# Patient Record
Sex: Female | Born: 1980 | Hispanic: Yes | Marital: Married | Smoking: Never smoker
Health system: Southern US, Community
[De-identification: ages and names within clinical notes are randomized; demographics above are authoritative.]

---

## 2006-04-25 ENCOUNTER — Inpatient Hospital Stay (HOSPITAL_COMMUNITY): Admission: AD | Admit: 2006-04-25 | Discharge: 2006-04-25 | Payer: Self-pay | Admitting: Obstetrics and Gynecology

## 2006-07-11 ENCOUNTER — Ambulatory Visit (HOSPITAL_COMMUNITY): Admission: RE | Admit: 2006-07-11 | Discharge: 2006-07-11 | Payer: Self-pay | Admitting: Obstetrics & Gynecology

## 2006-11-02 ENCOUNTER — Ambulatory Visit: Payer: Self-pay | Admitting: Obstetrics and Gynecology

## 2006-11-02 ENCOUNTER — Inpatient Hospital Stay (HOSPITAL_COMMUNITY): Admission: AD | Admit: 2006-11-02 | Discharge: 2006-11-02 | Payer: Self-pay | Admitting: Gynecology

## 2006-12-01 ENCOUNTER — Inpatient Hospital Stay (HOSPITAL_COMMUNITY): Admission: AD | Admit: 2006-12-01 | Discharge: 2006-12-03 | Payer: Self-pay | Admitting: Obstetrics & Gynecology

## 2006-12-01 ENCOUNTER — Ambulatory Visit: Payer: Self-pay | Admitting: Obstetrics & Gynecology

## 2006-12-02 ENCOUNTER — Ambulatory Visit: Payer: Self-pay | Admitting: Vascular Surgery

## 2007-01-31 ENCOUNTER — Emergency Department (HOSPITAL_COMMUNITY): Admission: EM | Admit: 2007-01-31 | Discharge: 2007-01-31 | Payer: Self-pay | Admitting: *Deleted

## 2007-03-26 ENCOUNTER — Encounter: Payer: Self-pay | Admitting: Obstetrics and Gynecology

## 2007-03-26 ENCOUNTER — Ambulatory Visit: Payer: Self-pay | Admitting: Obstetrics & Gynecology

## 2007-03-26 ENCOUNTER — Other Ambulatory Visit: Admission: RE | Admit: 2007-03-26 | Discharge: 2007-03-26 | Payer: Self-pay | Admitting: Obstetrics and Gynecology

## 2007-04-06 ENCOUNTER — Emergency Department (HOSPITAL_COMMUNITY): Admission: EM | Admit: 2007-04-06 | Discharge: 2007-04-06 | Payer: Self-pay | Admitting: Emergency Medicine

## 2007-09-13 IMAGING — US US OB TRANSVAGINAL MODIFY
1 series · 14 of 28 positions shown · non-contrast
Comparison: none

CLINICAL DATA: Positive pregnancy test with vaginal spotting.

 OBSTETRICAL ULTRASOUND <14 WKS AND TRANSVAGINAL OB US:
 Number of Fetuses:  1
 Heart Rate:  133 bpm
 CRL:    1.1 mm   7 w  1 d  US EDC:  12/11/06
 Fetal anatomy could not be evaluated due to the early gestational age.
 MATERNAL UTERINE AND ADNEXAL FINDINGS
 Cervix not evaluated.

[Series 1: us ob transvaginal modify · 0.31mm/px · 14 of 67 slices shown]
[im 3/67]
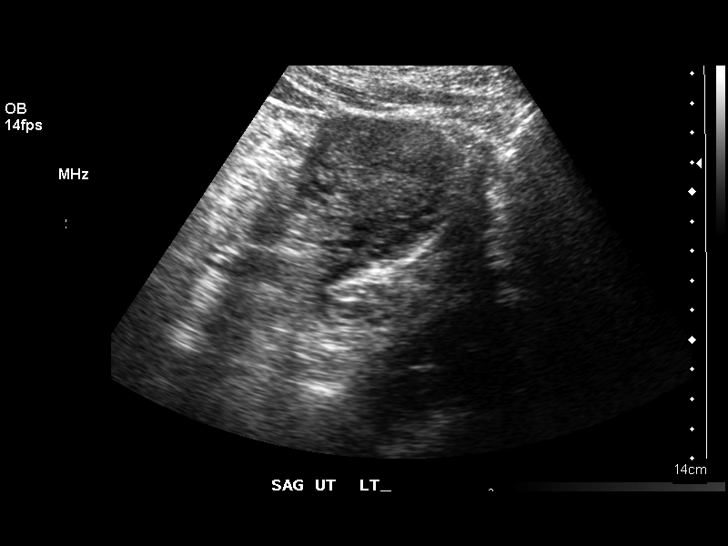
[im 8/67]
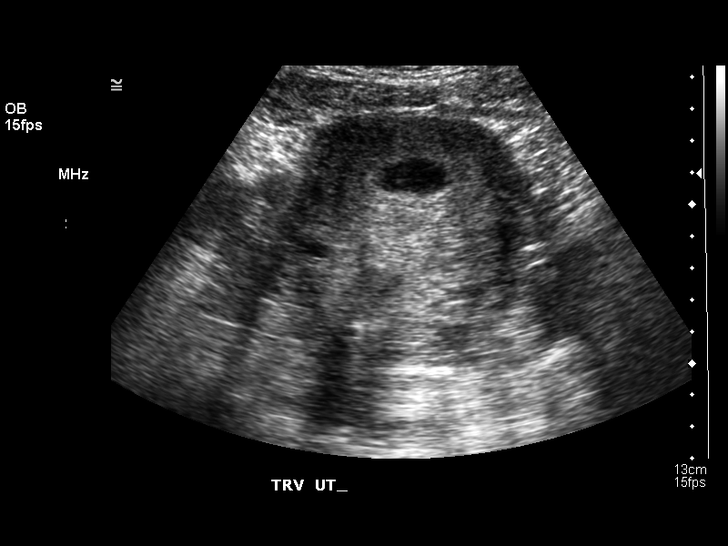
[im 13/67]
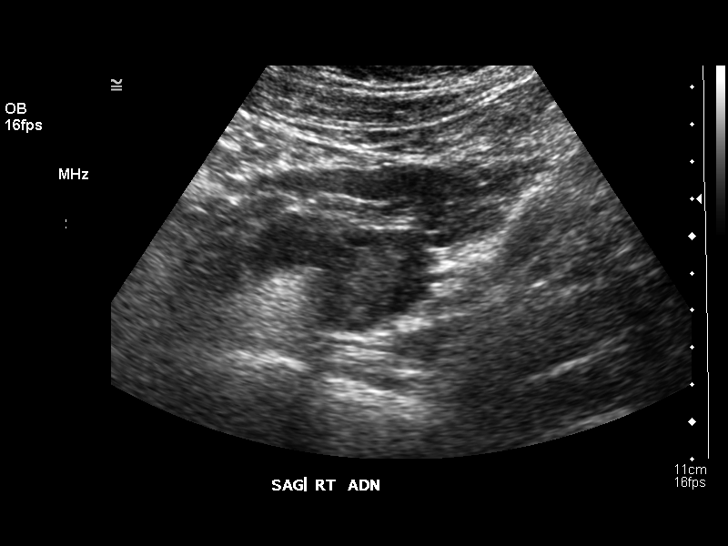
[im 18/67]
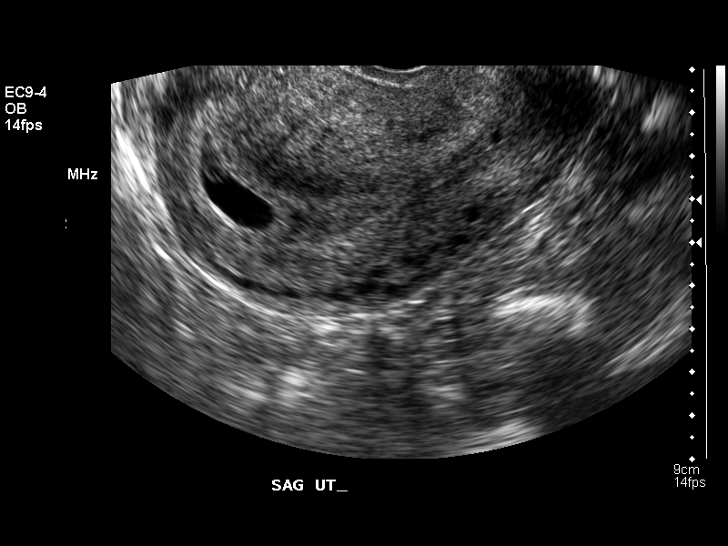
[im 23/67]
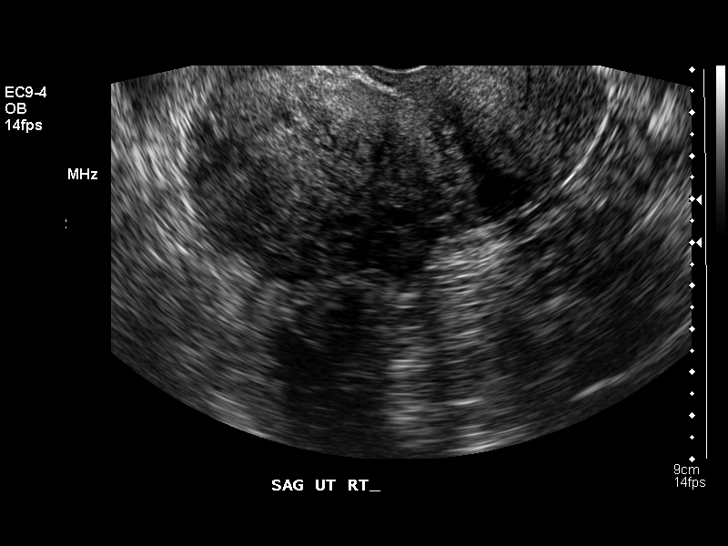
[im 27/67]
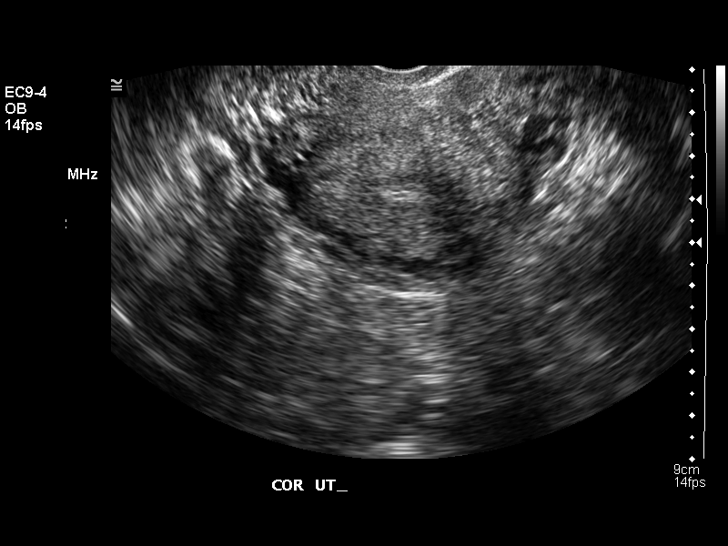
[im 32/67]
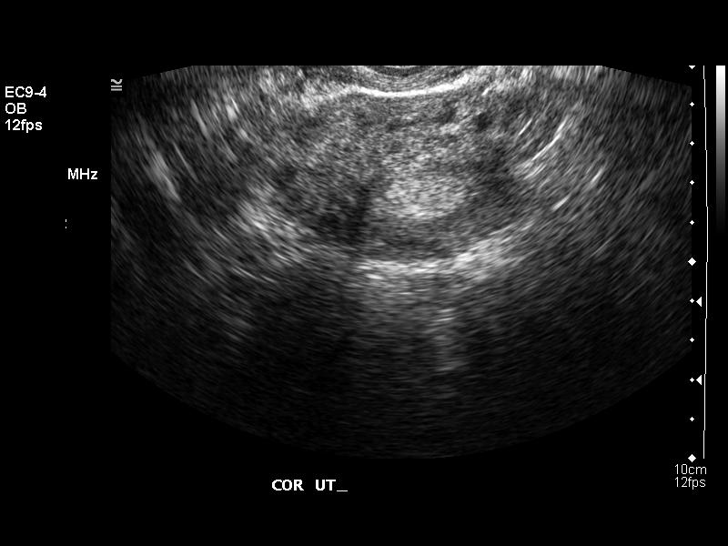
[im 37/67]
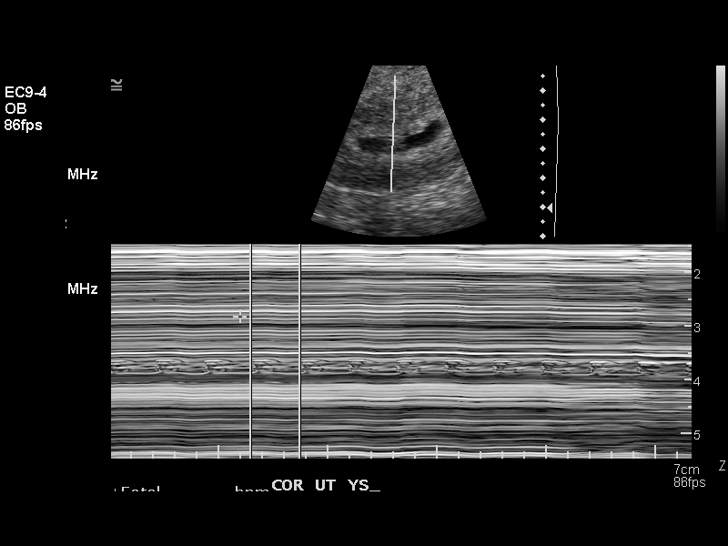
[im 42/67]
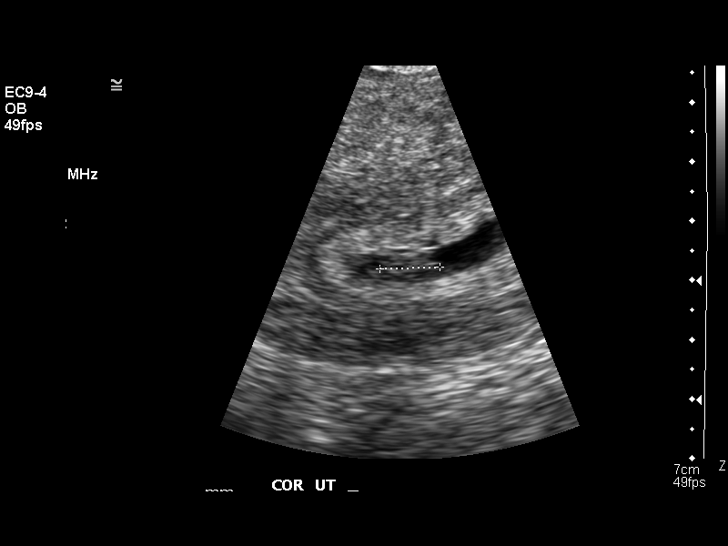
[im 47/67]
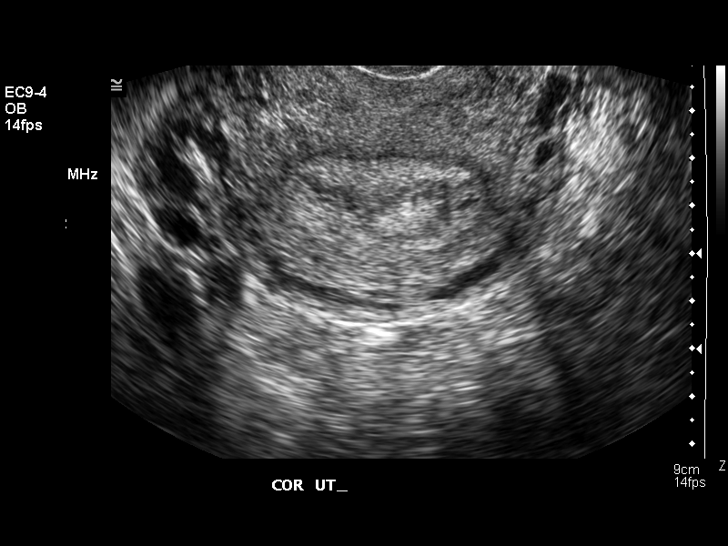
[im 52/67]
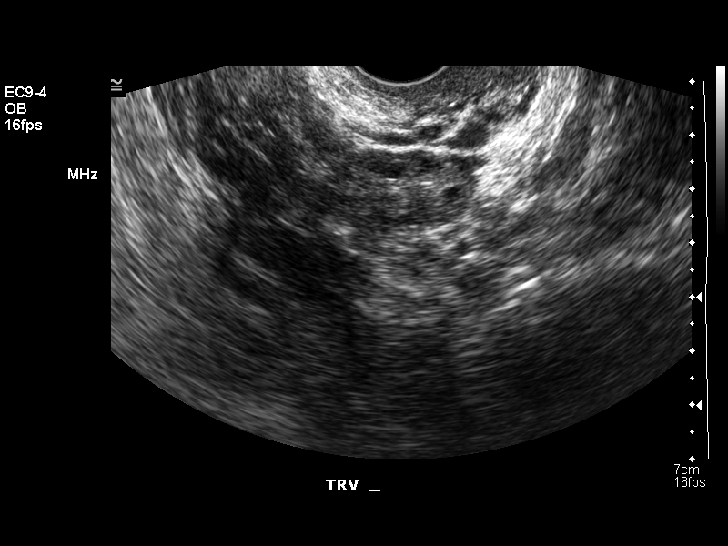
[im 57/67]
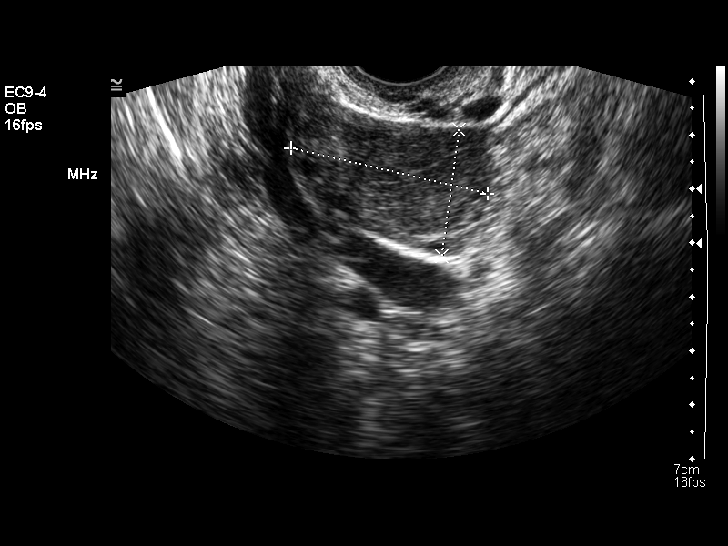
[im 62/67]
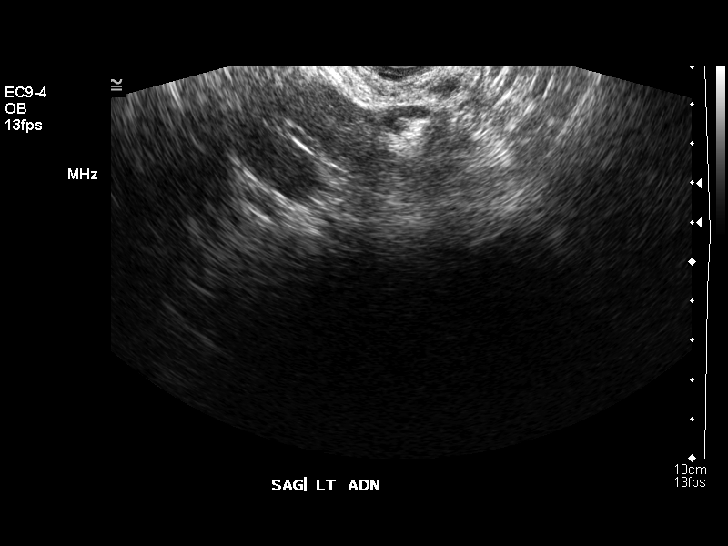
[im 67/67]
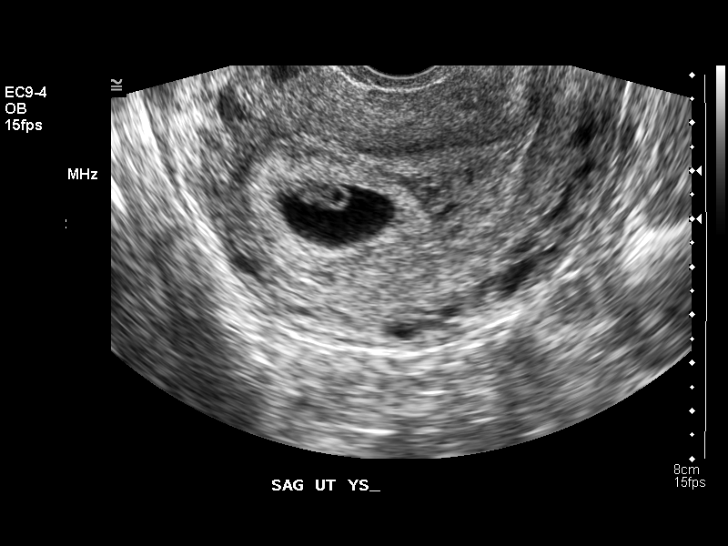

[14 of 28 positions shown; findings below may reference images not displayed]

IMPRESSION: 1.  Single living intrauterine gestation at an estimated mean 7 week 1 day gestational age by crown-rump length.  Ultrasound EDC is estimated at 12/11/06.
 2.  Small subchorionic hemorrhage noted.

## 2007-11-29 IMAGING — US US OB COMP +14 WK
2 series · 13 of 28 positions shown · non-contrast
Comparison: none

CLINICAL DATA: Anatomic exam.  No current problems.

[Series 1: us ob comp +14 wk · 0.16mm/px · 1 of 6 slices shown (1 of 2)]
[im 6/6]
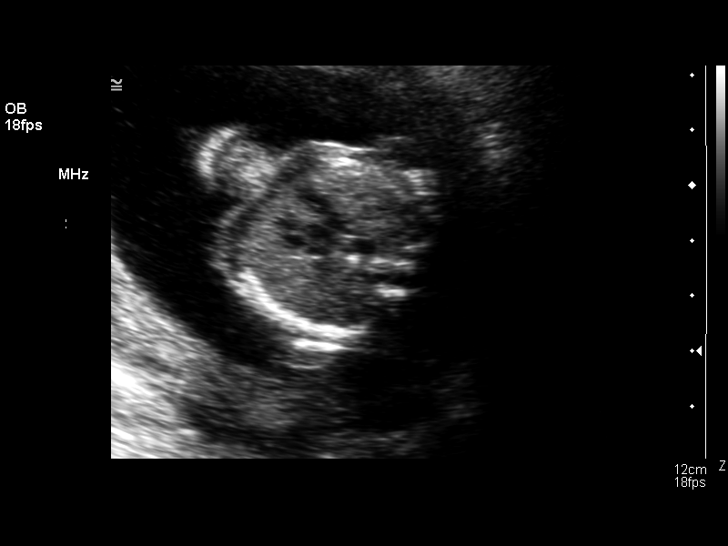

[Series 1: us ob comp +14 wk · 0.16mm/px · 12 of 103 slices shown (2 of 2)]
[im 5/103]
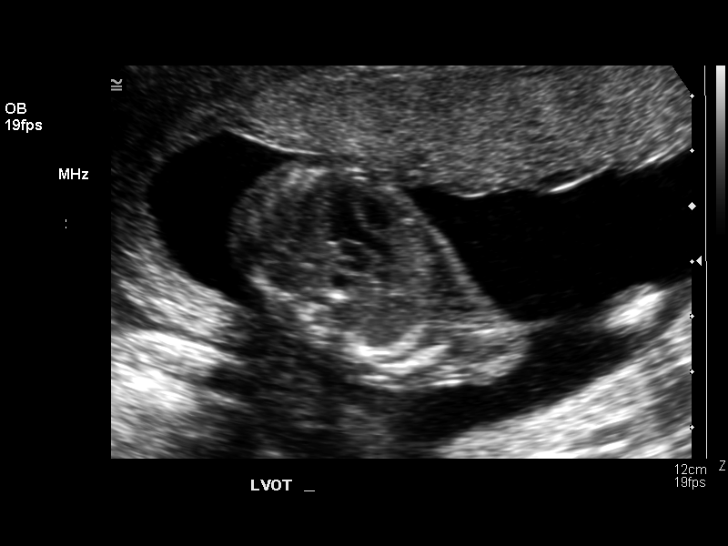
[im 13/103]
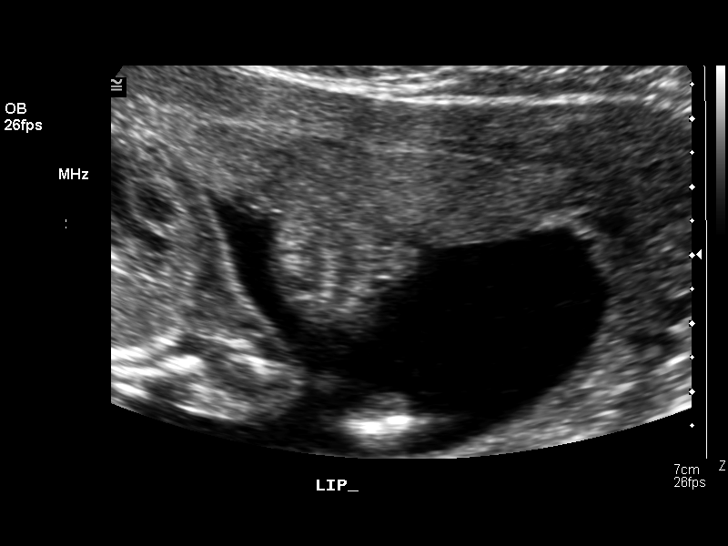
[im 21/103]
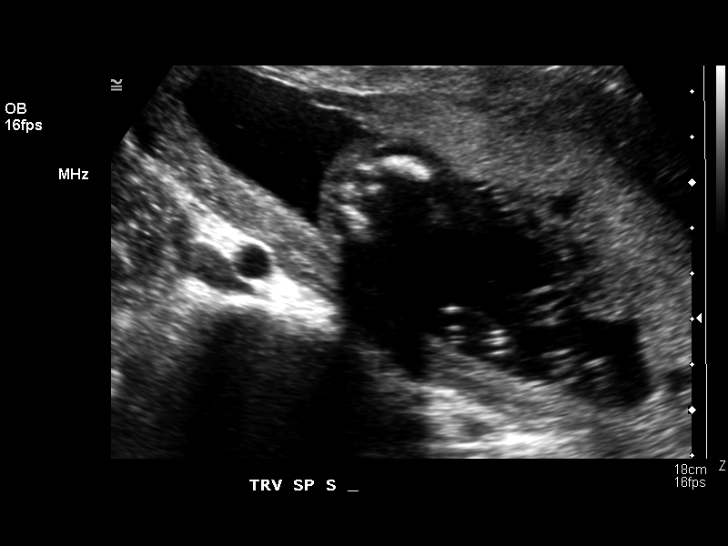
[im 29/103]
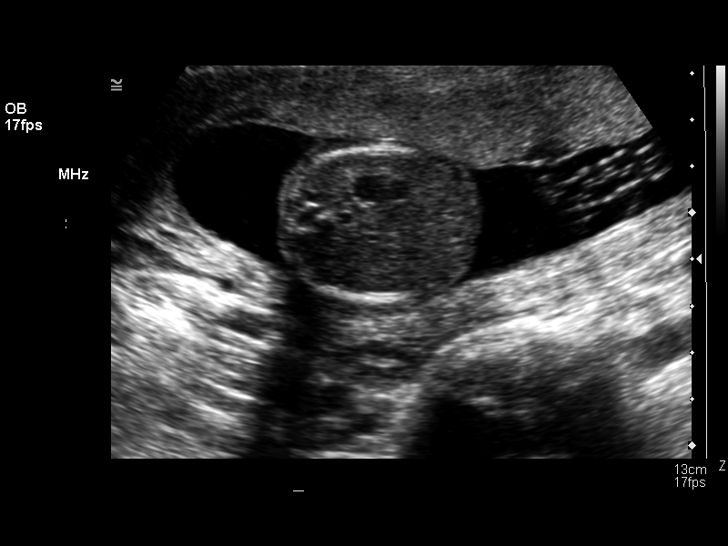
[im 37/103]
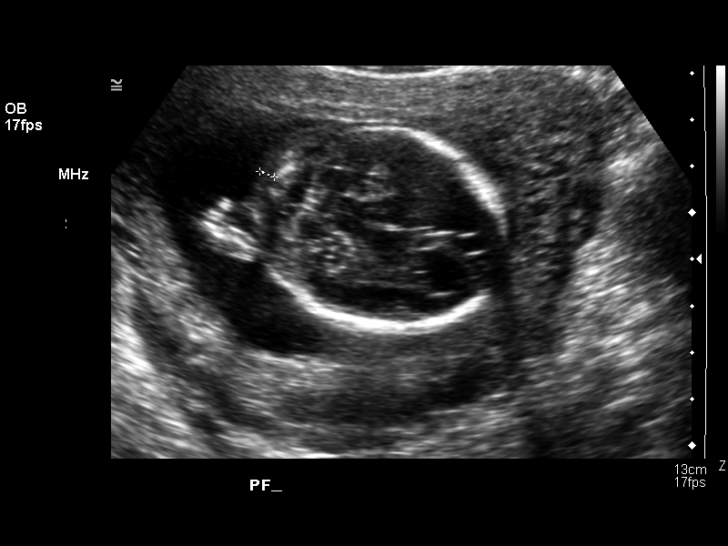
[im 49/103]
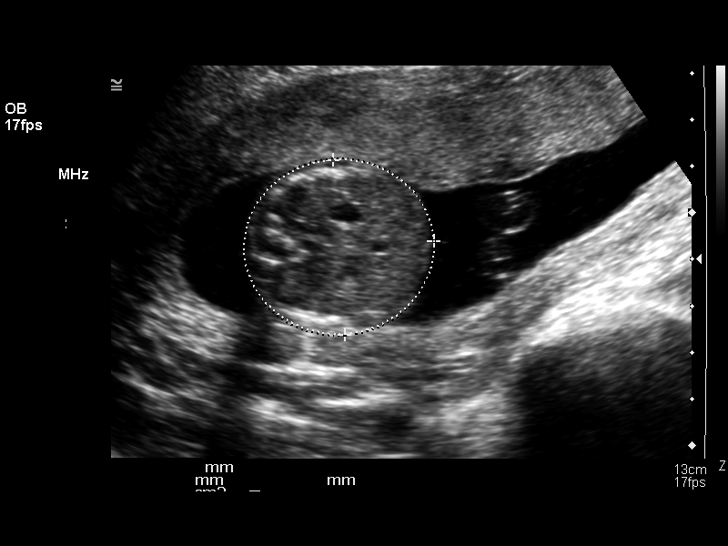
[im 58/103]
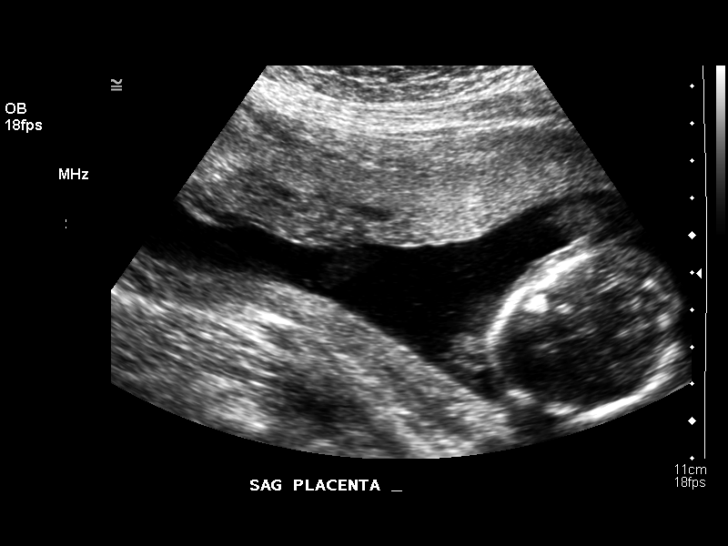
[im 66/103]
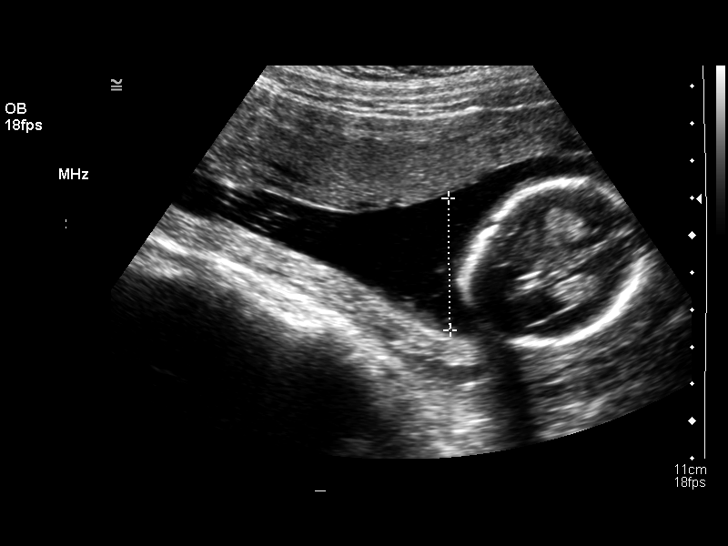
[im 74/103]
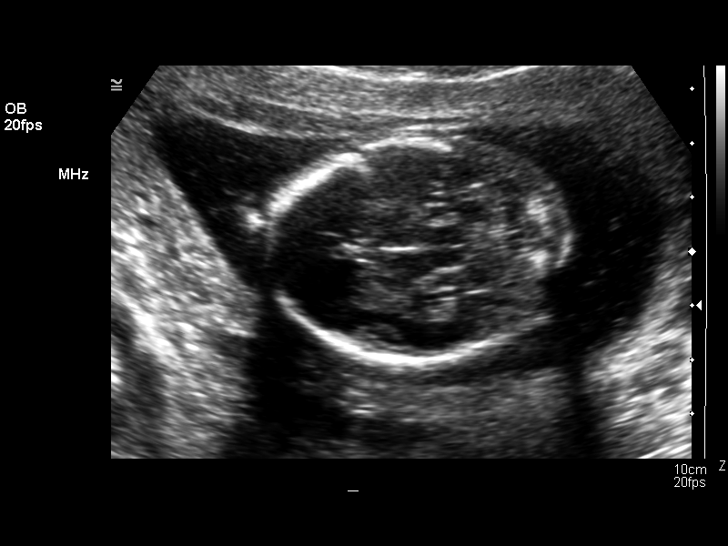
[im 82/103]
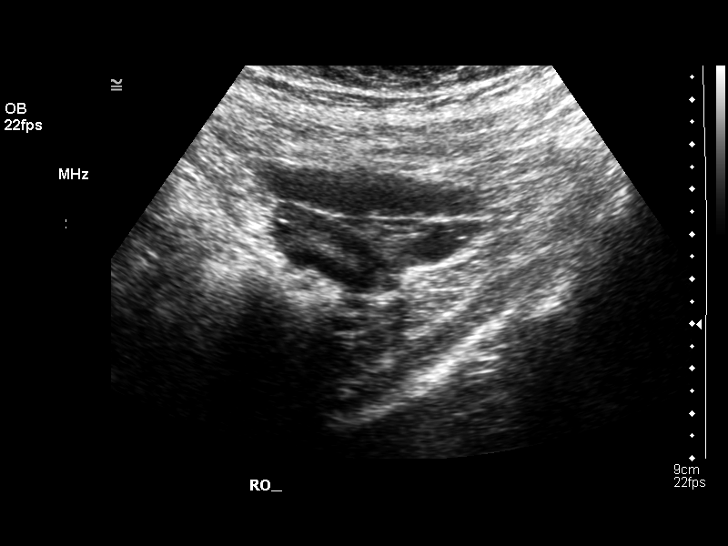
[im 90/103]
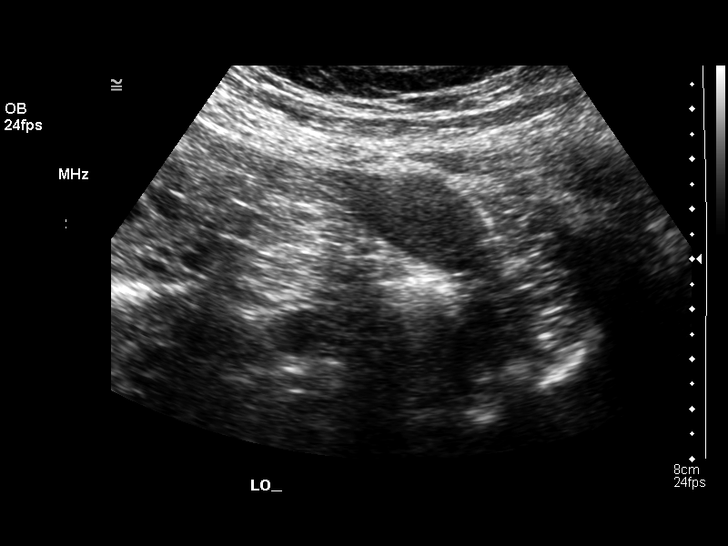
[im 98/103]
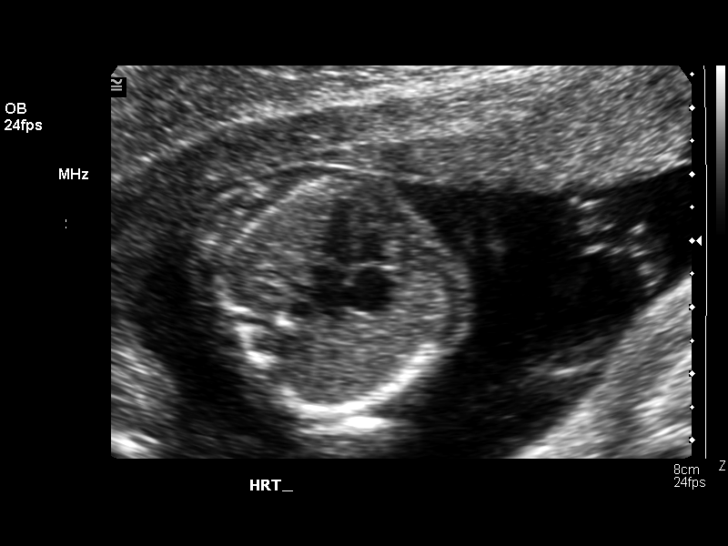

[13 of 28 positions shown; findings below may reference images not displayed]

OBSTETRICAL ULTRASOUND:
 Number of Fetuses:  1
 Heart Rate:  147 bpm
 Movement:  Yes
 Breathing:  No  
 Presentation:  Cephalic
 Placental Location:  Anterior
 Grade:  I
 Previa:  No
 Amniotic Fluid (Subjective):  Normal
 Amniotic Fluid (Objective):   3.6 cm vertical pocket 

 FETAL BIOMETRY
 BPD:  4.2 cm   18 w 4 d 
 HC:  15.3 cm   18 w 2 d 
 AC:  12.4 cm   18 w 0 d 
 FL:  2.8 cm     18 w 5 d 

 MEAN GA:  18 w 3 d   US EDC:  12/09/06
 Assigned GA:  18 w 1 d   Assigned EDC:  12/11/06

 FETAL ANATOMY
 Lateral Ventricles:  Visualized 
 Thalami/CSP:  Visualized 
 Posterior Fossa:  Visualized 
 Nuchal Region:  NF= 3.2 mm   Visualized 
 Spine:  Visualized 
 4 Chamber Heart on Left:  Visualized 
 Stomach on Left:  Visualized 
 3 Vessel Cord:  Visualized 
 Cord Insertion site:  Visualized 
 Kidneys:  Visualized 
 Bladder:  Visualized 
 Extremities:  Visualized 

 ADDITIONAL ANATOMY VISUALIZED:  LVOT, RVOT, upper lip, orbits, profile, diaphragm, heel, ductal arch, and aortic arch.

 MATERNAL UTERINE AND ADNEXAL FINDINGS
 Cervix:   3.6 cm transabdominal.
IMPRESSION: 1.  Single living intrauterine pregnancy demonstrating an estimated gestational age by ultrasound of 18 weeks 3 days.  Correlation with assigned gestational age by initial ultrasound of 18 weeks 1 day correlates with appropriate growth.  
 2.  No focal fetal or placental abnormalities are noted with a good anatomic exam possible. Only the 5th digit could not be seen with confidence due to positioning on today?s exam.

## 2007-12-16 ENCOUNTER — Emergency Department (HOSPITAL_COMMUNITY): Admission: EM | Admit: 2007-12-16 | Discharge: 2007-12-16 | Payer: Self-pay | Admitting: Emergency Medicine

## 2008-02-16 ENCOUNTER — Inpatient Hospital Stay (HOSPITAL_COMMUNITY): Admission: AD | Admit: 2008-02-16 | Discharge: 2008-02-16 | Payer: Self-pay | Admitting: Family Medicine

## 2008-09-29 ENCOUNTER — Emergency Department (HOSPITAL_COMMUNITY): Admission: EM | Admit: 2008-09-29 | Discharge: 2008-09-30 | Payer: Self-pay | Admitting: Emergency Medicine

## 2010-02-18 IMAGING — CT CT HEAD W/O CM
1 of 2 series · 13 of 30 positions shown, 17 images · non-contrast
Comparison: None

CLINICAL DATA: Headache

CT HEAD WITHOUT CONTRAST
TECHNIQUE: Contiguous axial images were obtained from the base of
the skull through the vertex without contrast.

[Series 2: brain · axial · 0.48mm/px · z∈[+155,+290]mm · 13 of 32 slices shown, 17 images]
[im 3/32  brain]
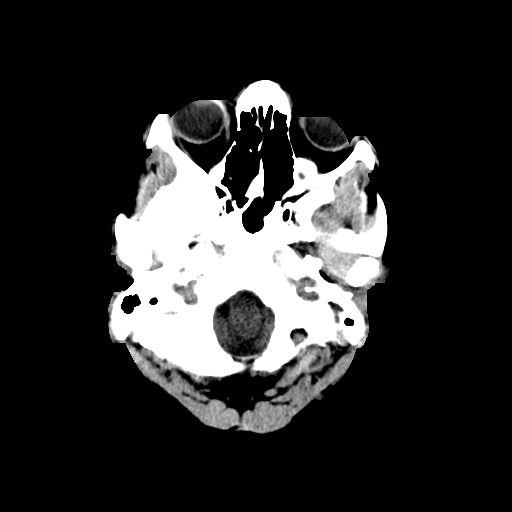
[im 3/32  bone]
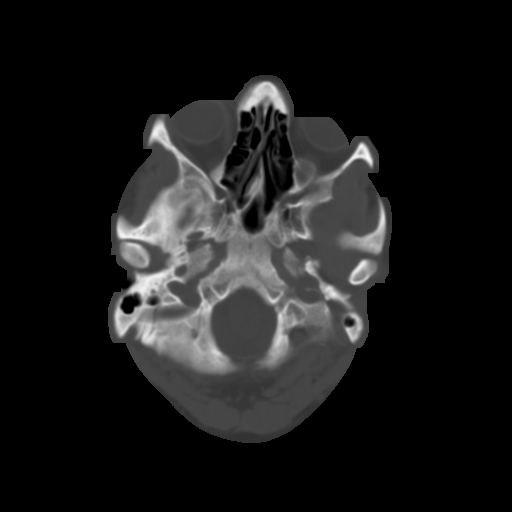
[im 5/32  brain]
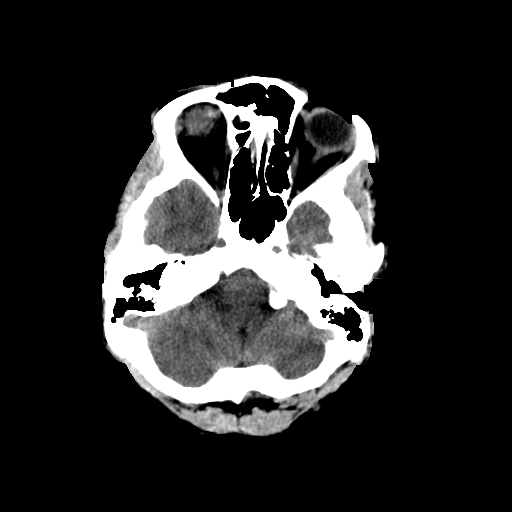
[im 7/32  brain]
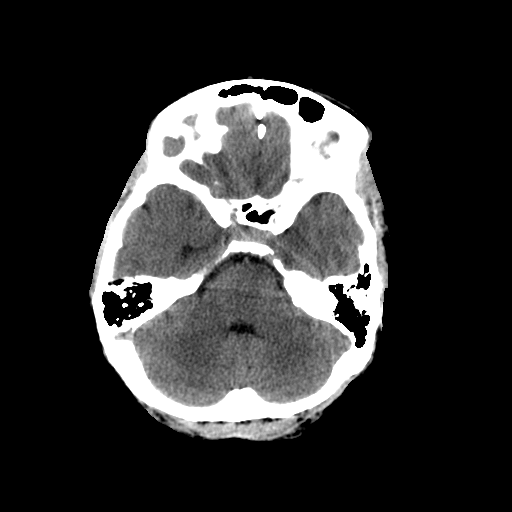
[im 9/32  brain]
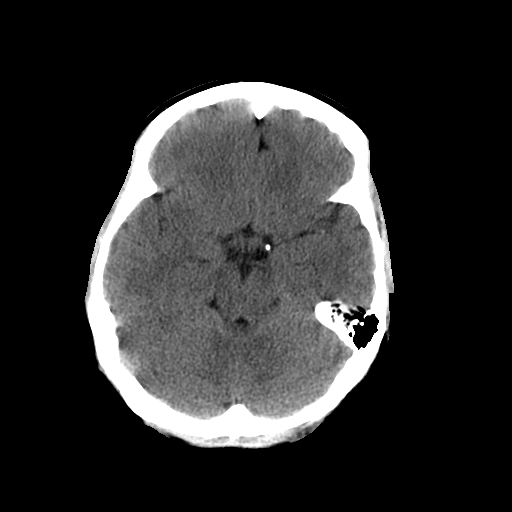
[im 12/32  brain]
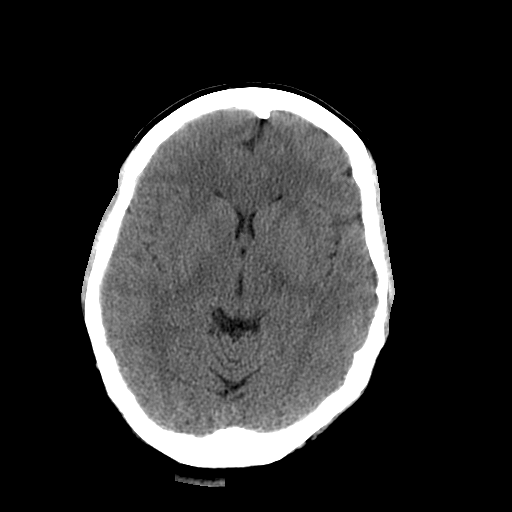
[im 12/32  bone]
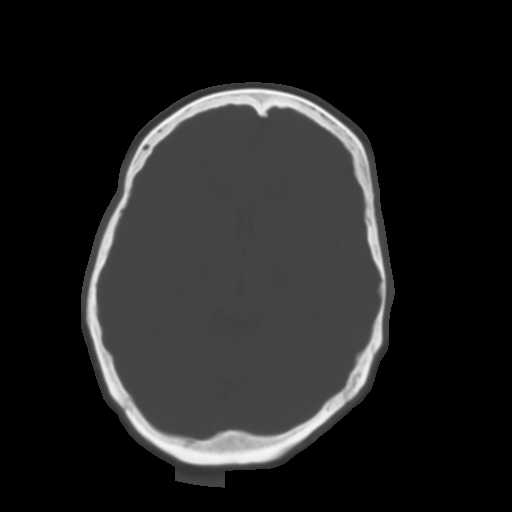
[im 14/32  brain]
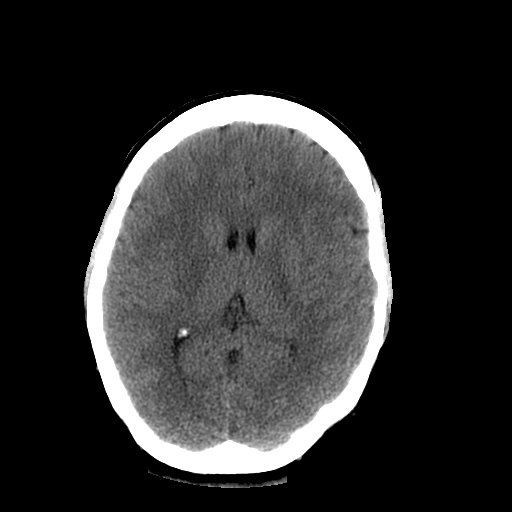
[im 16/32  brain]
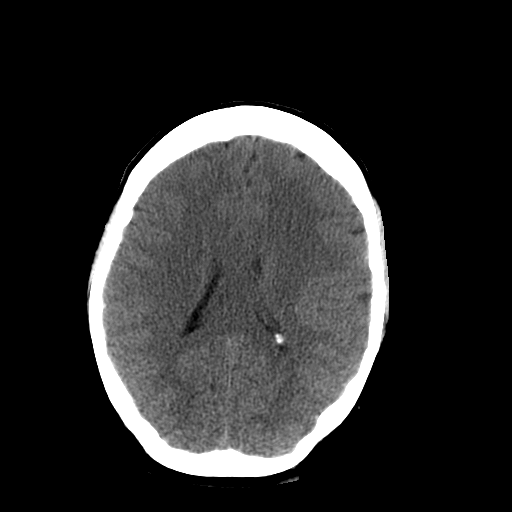
[im 18/32  brain]
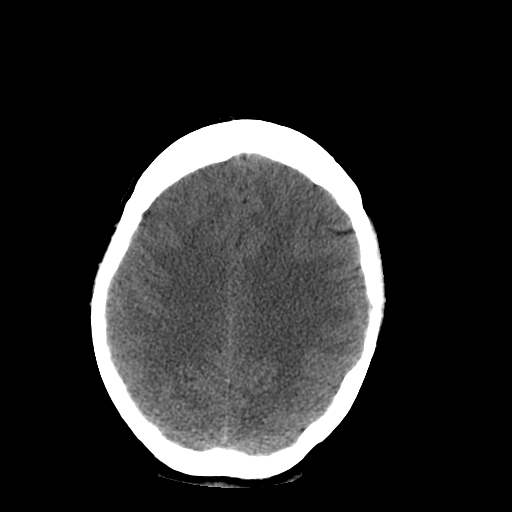
[im 20/32  brain]
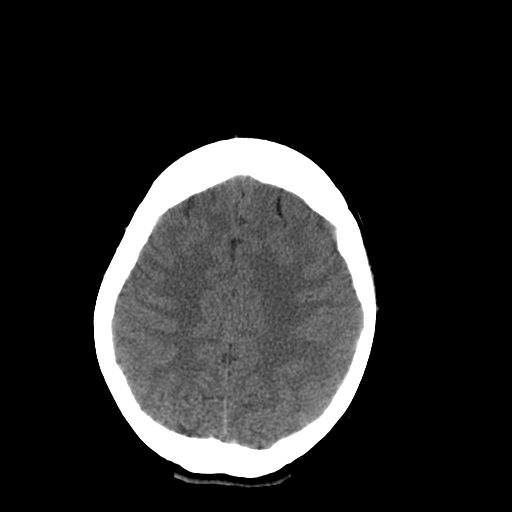
[im 20/32  bone]
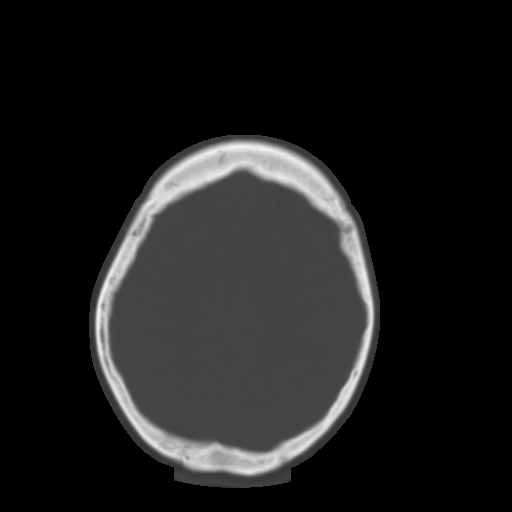
[im 23/32  brain]
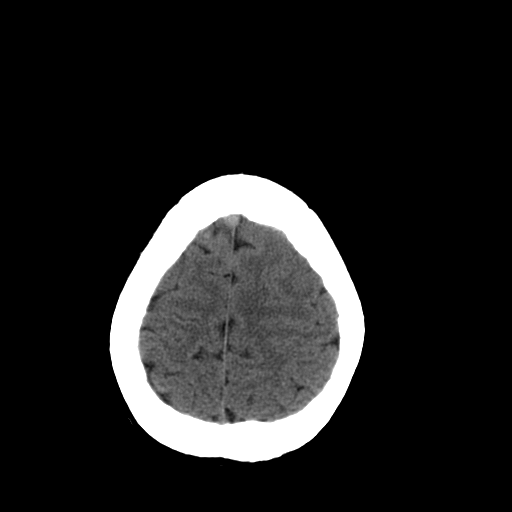
[im 25/32  brain]
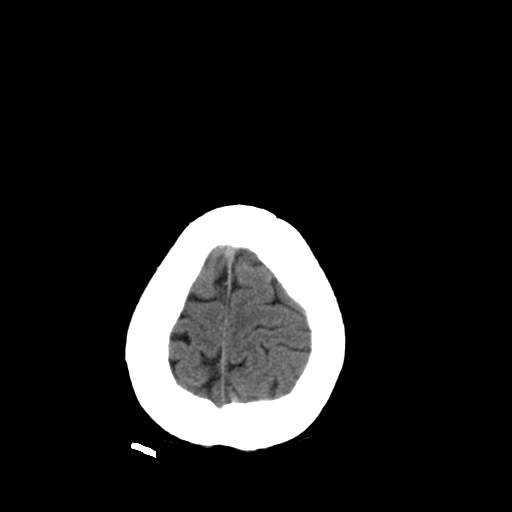
[im 27/32  brain]
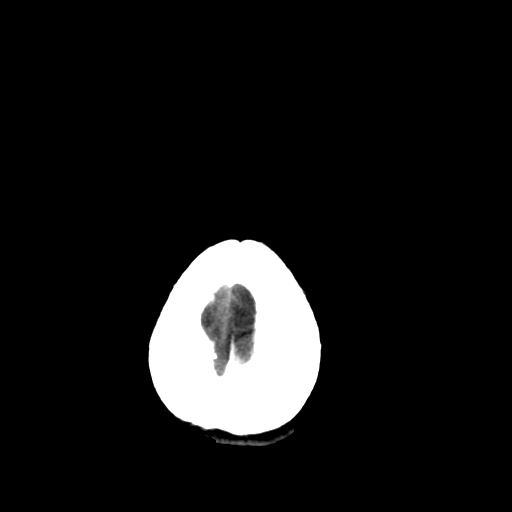
[im 29/32  brain]
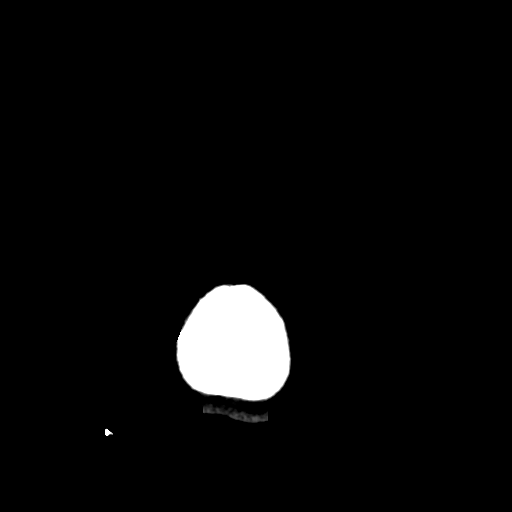
[im 29/32  bone]
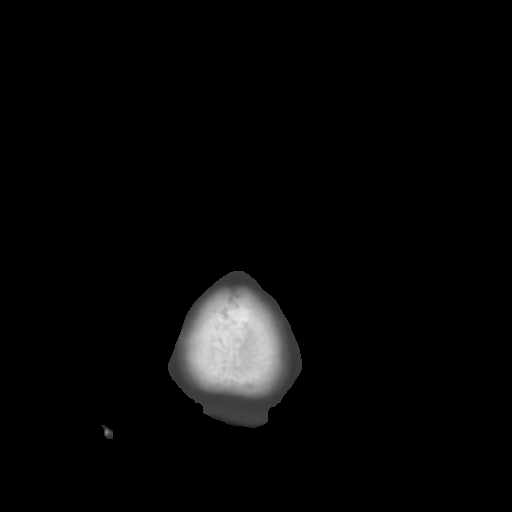

[13 of 30 positions shown; findings below may reference images not displayed]

FINDINGS: Normal ventricular morphology.
No midline shift or mass effect.
Normal appearance of brain parenchyma.
No intracranial hemorrhage, mass lesion, or acute infarct.
Bones unremarkable.
Opacified left maxillary sinus, question maxillary sinusitis.
IMPRESSION: Question left maxillary sinusitis.
No acute intracranial abnormalities.

## 2010-12-25 LAB — DIFFERENTIAL
Basophils Absolute: 0 10*3/uL (ref 0.0–0.1)
Basophils Relative: 0 % (ref 0–1)
Eosinophils Absolute: 0.1 10*3/uL (ref 0.0–0.7)
Eosinophils Relative: 1 % (ref 0–5)
Lymphocytes Relative: 18 % (ref 12–46)
Lymphs Abs: 1.3 10*3/uL (ref 0.7–4.0)
Monocytes Absolute: 0.6 10*3/uL (ref 0.1–1.0)
Monocytes Relative: 8 % (ref 3–12)
Neutro Abs: 5.5 10*3/uL (ref 1.7–7.7)
Neutrophils Relative %: 73 % (ref 43–77)

## 2010-12-25 LAB — CBC
HCT: 33.7 % — ABNORMAL LOW (ref 36.0–46.0)
Hemoglobin: 11.2 g/dL — ABNORMAL LOW (ref 12.0–15.0)
MCHC: 33.2 g/dL (ref 30.0–36.0)
MCV: 80.3 fL (ref 78.0–100.0)
Platelets: 173 10*3/uL (ref 150–400)
RBC: 4.2 MIL/uL (ref 3.87–5.11)
RDW: 14.9 % (ref 11.5–15.5)
WBC: 7.5 10*3/uL (ref 4.0–10.5)

## 2010-12-25 LAB — URINALYSIS, ROUTINE W REFLEX MICROSCOPIC
Bilirubin Urine: NEGATIVE
Glucose, UA: NEGATIVE mg/dL
Hgb urine dipstick: NEGATIVE
Ketones, ur: NEGATIVE mg/dL
Nitrite: NEGATIVE
Protein, ur: NEGATIVE mg/dL
Specific Gravity, Urine: 1.03 (ref 1.005–1.030)
Urobilinogen, UA: 1 mg/dL (ref 0.0–1.0)
pH: 7 (ref 5.0–8.0)

## 2010-12-25 LAB — BASIC METABOLIC PANEL
BUN: 17 mg/dL (ref 6–23)
CO2: 28 mEq/L (ref 19–32)
Calcium: 9.1 mg/dL (ref 8.4–10.5)
Chloride: 107 mEq/L (ref 96–112)
Creatinine, Ser: 0.52 mg/dL (ref 0.4–1.2)
GFR calc Af Amer: >60 mL/min (ref >60)
GFR calc non Af Amer: >60 mL/min (ref >60)
Glucose, Bld: 108 mg/dL — ABNORMAL HIGH (ref 70–99)
Potassium: 3.9 mEq/L (ref 3.5–5.1)
Sodium: 139 mEq/L (ref 135–145)

## 2010-12-25 LAB — POCT PREGNANCY, URINE: Preg Test, Ur: NEGATIVE

## 2012-07-12 ENCOUNTER — Encounter (HOSPITAL_COMMUNITY): Payer: Self-pay | Admitting: Radiology

## 2012-07-12 ENCOUNTER — Emergency Department (HOSPITAL_COMMUNITY)
Admission: EM | Admit: 2012-07-12 | Discharge: 2012-07-12 | Disposition: A | Discharge status: Home / Self Care | Payer: No Typology Code available for payment source | Attending: Emergency Medicine | Admitting: Emergency Medicine

## 2012-07-12 DIAGNOSIS — S4980XA Other specified injuries of shoulder and upper arm, unspecified arm, initial encounter: Secondary | ICD-10-CM | POA: Insufficient documentation

## 2012-07-12 DIAGNOSIS — Y939 Activity, unspecified: Secondary | ICD-10-CM | POA: Insufficient documentation

## 2012-07-12 DIAGNOSIS — S46909A Unspecified injury of unspecified muscle, fascia and tendon at shoulder and upper arm level, unspecified arm, initial encounter: Secondary | ICD-10-CM | POA: Insufficient documentation

## 2012-07-12 MED ORDER — DIAZEPAM 5 MG PO TABS: 5.0000 mg | ORAL_TABLET | Freq: Two times a day (BID) | ORAL | Status: DC

## 2012-07-12 MED ORDER — HYDROCODONE-ACETAMINOPHEN 5-325 MG PO TABS: 2.0000 | ORAL_TABLET | Freq: Four times a day (QID) | ORAL | Status: DC | PRN
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
## 2012-07-14 NOTE — ED Provider Notes (Signed)
Medical screening examination/treatment/procedure(s) were performed by non-physician practitioner and as supervising physician I was immediately available for consultation/collaboration.   Gaddiel Cullens E Reizel Calzada, MD 07/14/12 0809 

## 2013-05-15 ENCOUNTER — Emergency Department (INDEPENDENT_AMBULATORY_CARE_PROVIDER_SITE_OTHER)
Admission: EM | Admit: 2013-05-15 | Discharge: 2013-05-15 | Disposition: A | Discharge status: Home / Self Care | Payer: Self-pay | Source: Home / Self Care | Attending: Emergency Medicine | Admitting: Emergency Medicine

## 2013-05-15 ENCOUNTER — Encounter (HOSPITAL_COMMUNITY): Payer: Self-pay | Admitting: Emergency Medicine

## 2013-05-15 DIAGNOSIS — N61 Mastitis without abscess: Secondary | ICD-10-CM

## 2013-05-15 MED ORDER — CLINDAMYCIN HCL 300 MG PO CAPS: 300.0000 mg | ORAL_CAPSULE | Freq: Four times a day (QID) | ORAL | Status: DC

## 2013-05-15 MED ORDER — TRAMADOL HCL 50 MG PO TABS: 100.0000 mg | ORAL_TABLET | Freq: Three times a day (TID) | ORAL | Status: DC | PRN

## 2013-05-15 NOTE — ED Provider Notes (Signed)
Chief Complaint:   Chief Complaint  Patient presents with  . Breast Pain    History of Present Illness:   Heather Schneider is a 32 year old female who has had a three-day history of pain in her right breast, just near the areola. There's been some clear drainage. She can feel some thickening of the skin. She denies any fever or chills. She's felt slightly nauseated. She's never had anything like this before. Is not pregnant or breast-feeding.  Review of Systems:  Other than noted above, the patient denies any of the following symptoms: Systemic:  No fever, chills, sweats, weight loss, or fatigue. ENT:  No nasal congestion, rhinorrhea, sore throat, swelling of lips, tongue or throat. Resp:  No cough, wheezing, or shortness of breath. Skin:  No rash, itching, nodules, or suspicious lesions.  PMFSH:  Past medical history, family history, social history, meds, and allergies were reviewed. She's allergic to penicillin. Last menstrual period was August 20.  Physical Exam:   Vital signs:  BP 103/64  Pulse 87  Temp(Src) 98.5 F (36.9 C) (Oral)  Resp 20  SpO2 96% Gen:  Alert, oriented, in no distress. ENT:  Pharynx clear, no intraoral lesions, moist mucous membranes. Lungs:  Clear to auscultation. Breast exam: There is tenderness to palpation and slight erythema just lateral to the areola of the right breast. There was no induration, mass, or swelling. There was no nipple discharge. Left breast exam was normal. Skin:  Clear without any rash.  Assessment:  The encounter diagnosis was Mastitis.  She has a very mild, localized mastitis. She'll need antibiotic treatments. She'll also need a mammogram in about 2 weeks and this has cleared up. The mammogram was ordered as a future order. Patient told to call back if she had not heard back from the breast Center by Tuesday.  Plan:   1.  The following meds were prescribed:   Discharge Medication List as of 05/15/2013  5:26 PM    START taking  these medications   Details  clindamycin (CLEOCIN) 300 MG capsule Take 1 capsule (300 mg total) by mouth 4 (four) times daily., Starting 05/15/2013, Until Discontinued, Normal    traMADol (ULTRAM) 50 MG tablet Take 2 tablets (100 mg total) by mouth every 8 (eight) hours as needed for pain., Starting 05/15/2013, Until Discontinued, Normal       2.  The patient was instructed in symptomatic care and handouts were given. Suggested moist heat. 3.  The patient was told to return if becoming worse in any way, if no better in 3 or 4 days, and given some red flag symptoms such as fever or worsening pain or swelling that would indicate earlier return. 4.  Follow up with a mammogram in 2 weeks.     Reuben Likes, MD 05/15/13 2258

## 2013-05-15 NOTE — ED Notes (Signed)
Breast pain, no known trauma; eval by MD only

## 2013-06-01 ENCOUNTER — Other Ambulatory Visit: Payer: Self-pay | Admitting: *Deleted

## 2013-06-01 DIAGNOSIS — N6452 Nipple discharge: Secondary | ICD-10-CM

## 2013-06-01 DIAGNOSIS — N644 Mastodynia: Secondary | ICD-10-CM

## 2013-06-02 ENCOUNTER — Ambulatory Visit (HOSPITAL_COMMUNITY)
Admission: RE | Admit: 2013-06-02 | Discharge: 2013-06-02 | Disposition: A | Discharge status: Home / Self Care | Payer: No Typology Code available for payment source | Source: Ambulatory Visit | Attending: Obstetrics and Gynecology | Admitting: Obstetrics and Gynecology

## 2013-06-02 ENCOUNTER — Encounter (HOSPITAL_COMMUNITY): Payer: Self-pay

## 2013-06-02 VITALS — BP 102/64 | Temp 98.5°F | Ht 66.0 in | Wt 162.6 lb

## 2013-06-02 DIAGNOSIS — Z1239 Encounter for other screening for malignant neoplasm of breast: Secondary | ICD-10-CM

## 2013-06-02 DIAGNOSIS — N6324 Unspecified lump in the left breast, lower inner quadrant: Secondary | ICD-10-CM | POA: Insufficient documentation

## 2013-06-02 NOTE — Patient Instructions (Addendum)
Taught Heather Schneider how to perform BSE and gave educational materials to take home. Patient did not need a Pap smear today due to last Pap smear was October 2012 per patient. Let her know [facility] will cover Pap smears every 3 years unless has a history of abnormal Pap smears. Let her know her next Pap smear is due next October and she can have done through [facility]. Referred patient to the [facility] for diagnostic mammogram. Appointment scheduled for Friday, June 12, 2013 at 1030. Patient aware of appointment and will be there.  Heather Schneider verbalized understanding.  Saifullah Jolley, Kathaleen Maser, RN 2:21 PM

## 2013-06-02 NOTE — Progress Notes (Signed)
Complaints of right breast pain x 1.5 months. Patient complains of right breast pain that comes and goes. Patient rates pain at a 4 out of 10. Patient stated she had some milky colored discharge and redness that has resolved.  Pap Smear:    Pap smear not completed today. Last Pap smear was October 2012 at the [facility] and normal per patient. Per patient has no history of an abnormal Pap smear. No Pap smear results in EPIC.  Physical exam: Breasts Breasts symmetrical. No skin abnormalities bilateral breasts. No nipple retraction bilateral breasts. No nipple discharge bilateral breasts. No lymphadenopathy. No lumps palpated right breast. Palpated a small lump within the left breast at 8 o'clock 6 cm from the nipple. Patient complained of tenderness when palpated left breast lump and in right lower outer breast. Referred patient to the [facility] for diagnostic mammogram. Appointment scheduled for Friday, June 12, 2013 at 1030.       Pelvic/Bimanual No Pap smear completed today since last Pap smear was October 2012. Pap smear not indicated per BCCCP guidelines.

## 2013-06-12 ENCOUNTER — Ambulatory Visit
Admission: RE | Admit: 2013-06-12 | Discharge: 2013-06-12 | Disposition: A | Discharge status: Home / Self Care | Payer: No Typology Code available for payment source | Source: Ambulatory Visit | Attending: Obstetrics and Gynecology | Admitting: Obstetrics and Gynecology

## 2013-06-12 ENCOUNTER — Other Ambulatory Visit: Payer: Self-pay | Admitting: Obstetrics and Gynecology

## 2013-06-12 DIAGNOSIS — N6452 Nipple discharge: Secondary | ICD-10-CM

## 2013-06-12 DIAGNOSIS — N644 Mastodynia: Secondary | ICD-10-CM

## 2014-07-12 ENCOUNTER — Encounter (HOSPITAL_COMMUNITY): Payer: Self-pay

## 2015-12-28 ENCOUNTER — Ambulatory Visit (INDEPENDENT_AMBULATORY_CARE_PROVIDER_SITE_OTHER): Payer: Self-pay | Admitting: Urgent Care

## 2015-12-28 VITALS — BP 126/80 | HR 79 | Temp 98.0°F | Resp 17 | Ht 65.5 in | Wt 149.0 lb

## 2015-12-28 DIAGNOSIS — R202 Paresthesia of skin: Secondary | ICD-10-CM

## 2015-12-28 DIAGNOSIS — R351 Nocturia: Secondary | ICD-10-CM

## 2015-12-28 DIAGNOSIS — R200 Anesthesia of skin: Secondary | ICD-10-CM

## 2015-12-28 DIAGNOSIS — R110 Nausea: Secondary | ICD-10-CM

## 2015-12-28 DIAGNOSIS — R630 Anorexia: Secondary | ICD-10-CM

## 2015-12-28 DIAGNOSIS — H538 Other visual disturbances: Secondary | ICD-10-CM

## 2015-12-28 DIAGNOSIS — D649 Anemia, unspecified: Secondary | ICD-10-CM

## 2015-12-28 DIAGNOSIS — F4541 Pain disorder exclusively related to psychological factors: Secondary | ICD-10-CM

## 2015-12-28 DIAGNOSIS — E860 Dehydration: Secondary | ICD-10-CM

## 2015-12-28 DIAGNOSIS — Z630 Problems in relationship with spouse or partner: Secondary | ICD-10-CM

## 2015-12-28 LAB — POCT CBC
GRANULOCYTE PERCENT: 53.3 % (ref 37–80)
HCT, POC: 31.5 % — AB (ref 37.7–47.9)
Hemoglobin: 10.7 g/dL — AB (ref 12.2–16.2)
Lymph, poc: 1.7 (ref 0.6–3.4)
MCH: 26.3 pg — AB (ref 27–31.2)
MCHC: 34 g/dL (ref 31.8–35.4)
MCV: 77.4 fL — AB (ref 80–97)
MID (CBC): 0.4 (ref 0–0.9)
MPV: 10.6 fL (ref 0–99.8)
POC Granulocyte: 2.4 (ref 2–6.9)
POC LYMPH PERCENT: 37.7 %L (ref 10–50)
POC MID %: 9 % (ref 0–12)
Platelet Count, POC: 138 10*3/uL — AB (ref 142–424)
RBC: 4.08 M/uL (ref 4.04–5.48)
RDW, POC: 16.5 %
WBC: 4.5 10*3/uL — AB (ref 4.6–10.2)

## 2015-12-28 LAB — COMPREHENSIVE METABOLIC PANEL
ALBUMIN: 4.1 g/dL (ref 3.6–5.1)
ALT: 10 U/L (ref 6–29)
AST: 11 U/L (ref 10–30)
Alkaline Phosphatase: 54 U/L (ref 33–115)
BUN: 18 mg/dL (ref 7–25)
CHLORIDE: 105 mmol/L (ref 98–110)
CO2: 27 mmol/L (ref 20–31)
Calcium: 9.3 mg/dL (ref 8.6–10.2)
Creat: 0.64 mg/dL (ref 0.50–1.10)
Glucose, Bld: 92 mg/dL (ref 65–99)
POTASSIUM: 4.1 mmol/L (ref 3.5–5.3)
Sodium: 141 mmol/L (ref 135–146)
TOTAL PROTEIN: 6.9 g/dL (ref 6.1–8.1)
Total Bilirubin: 0.7 mg/dL (ref 0.2–1.2)

## 2015-12-28 LAB — POCT GLYCOSYLATED HEMOGLOBIN (HGB A1C): Hemoglobin A1C: 5.3

## 2015-12-28 LAB — POCT URINALYSIS DIP (MANUAL ENTRY)
BILIRUBIN UA: NEGATIVE
Bilirubin, UA: NEGATIVE
GLUCOSE UA: NEGATIVE
Leukocytes, UA: NEGATIVE
Nitrite, UA: NEGATIVE
Protein Ur, POC: NEGATIVE
Urobilinogen, UA: 1
pH, UA: 5.5

## 2015-12-28 LAB — FERRITIN: FERRITIN: 7 ng/mL — AB (ref 10–154)

## 2015-12-28 LAB — TSH: TSH: 0.75 m[IU]/L

## 2015-12-28 MED ORDER — FERROUS SULFATE 325 (65 FE) MG PO TABS: 325.0000 mg | ORAL_TABLET | Freq: Two times a day (BID) | ORAL | Status: DC

## 2015-12-28 MED ORDER — DOCUSATE SODIUM 50 MG PO CAPS: 50.0000 mg | ORAL_CAPSULE | Freq: Two times a day (BID) | ORAL | Status: DC | PRN

## 2015-12-28 MED ORDER — POLYETHYLENE GLYCOL 3350 17 GM/SCOOP PO POWD: 17.0000 g | ORAL | Status: DC

## 2015-12-28 NOTE — Patient Instructions (Addendum)
Cefalea tensional (Tension Headache) Una cefalea tensional es una sensacin de dolor o presin que suele manifestarse en la frente y los lados de la cabeza. Este es el tipo ms comn de dolor de cabeza. El dolor puede ser sordo o puede sentirse que comprime (constrictivo). Generalmente, no se asocia con nuseas o vmitos y no empeora con la actividad fsica. Las cefaleas tensionales pueden durar de 30 minutos a varios das. CAUSAS Se desconoce la causa exacta de esta afeccin. Suelen comenzar despus de una situacin de estrs, ansiedad o por depresin. Otros factores desencadenantes pueden ser los siguientes:  Alcohol.  Demasiada cafena o abstinencia de cafena.  Infecciones respiratorias, como resfriados, gripes o sinusitis.  Problemas dentales o apretar los dientes.  Fatiga.  Mantener la cabeza y el cuello en la misma posicin durante un perodo prolongado, por ejemplo, al usar la computadora.  Fumar. SNTOMAS Los sntomas de esta afeccin incluyen lo siguiente:  Sensacin de presin alrededor de la cabeza.  Dolor "sordo" en la cabeza.  Dolor que siente sobre la frente y los lados de la cabeza.  Dolor a la palpacin en los msculos de la cabeza, del cuello y de los hombros. DIAGNSTICO Esta afeccin se puede diagnosticar en funcin de los sntomas y de un examen fsico. Pueden hacerle estudios, como una tomografa computarizada o una resonancia magntica de la cabeza. Estos estudios se indican si los sntomas son graves o fuera de lo comn. TRATAMIENTO Esta afeccin puede tratarse con cambios en el estilo de vida y medicamentos que lo ayuden a aliviar los sntomas. INSTRUCCIONES PARA EL CUIDADO EN EL HOGAR Control del dolor  Tome los medicamentos de venta libre y los recetados solamente como se lo haya indicado el mdico.  Cuando sienta dolor de cabeza acustese en un cuarto oscuro y tranquilo.  Si se lo indican, aplique hielo sobre la cabeza y la zona del cuello:  Ponga  el hielo en una bolsa plstica.  Coloque una toalla entre la piel y la bolsa de hielo.  Coloque el hielo durante 20minutos, 2 a 3veces por da.  Utilice una almohadilla trmica o tome una ducha con agua caliente para aplicar calor en la cabeza y la zona del cuello como se lo haya indicado el mdico. Comida y bebida  Mantenga un horario para las comidas.  Limite el consumo de bebidas alcohlicas.  Disminuya el consumo de cafena o deje de consumir cafena. Instrucciones generales  Concurra a todas las visitas de control como se lo haya indicado el mdico. Esto es importante.  Lleve un diario de los dolores de cabeza para averiguar qu factores pueden desencadenarlos. Por ejemplo, escriba los siguientes datos:  Lo que usted come y bebe.  Cunto tiempo duerme.  Algn cambio en su dieta o en los medicamentos.  Pruebe algunas tcnicas de relajacin, como los masajes.  Limite el estrs.  Sintese derecho y evite tensionar los msculos.  No consuma productos que contengan tabaco, incluidos cigarrillos, tabaco de mascar o cigarrillos electrnicos. Si necesita ayuda para dejar de fumar, consulte al mdico.  Haga actividad fsica habitualmente como se lo haya indicado el mdico.  Duerma entre 7 y 9horas o la cantidad de horas que le haya recomendado el mdico. SOLICITE ATENCIN MDICA SI:  Los medicamentos no logran aliviar los sntomas.  Tiene un dolor de cabeza que es diferente del dolor de cabeza habitual.  Tiene nuseas o vmitos.  Tiene fiebre. SOLICITE ATENCIN MDICA DE INMEDIATO SI:  El dolor se hace cada vez ms intenso.    Ha vomitado repetidas veces.  Presenta rigidez en el cuello.  Sufre prdida de la visin.  Tiene problemas para hablar.  Siente dolor en el ojo o en el odo.  Presenta debilidad muscular o prdida del control muscular.  Pierde el equilibrio o tiene problemas para Advertising account planner.  Sufre mareos o se desmaya.  Se siente confundido.   Esta  informacin no tiene Theme park manager el consejo del mdico. Asegrese de hacerle al mdico cualquier pregunta que tenga.   Document Released: 06/06/2005 Document Revised: 05/18/2015 Elsevier Interactive Patient Education 2016 Elsevier Inc.    Anemia inespecfica (Anemia, Nonspecific) La anemia es una enfermedad en la que la concentracin de glbulos rojos o el nivel de hemoglobina en la sangre estn por debajo de lo normal. La hemoglobina es la sustancia de los glbulos rojos que lleva el oxgeno a todo el cuerpo. La anemia da como resultado que los tejidos no reciban la cantidad suficiente de oxgeno.  CAUSAS  Las causas ms frecuentes de anemia son:   Sharlyne Pacas. El sangrado puede ser interno o externo. Incluye sangrado excesivo debido al perodo (en las mujeres) o por los intestinos.   Dficit nutricional.   Enfermedad renal, tiroidea o heptica crnicas.  Enfermedades de la mdula sea que disminuyen la produccin de glbulos rojos.  Cncer y tratamientos para Management consultant.  VIH, sida y sus tratamientos.  Trastornos del bazo que aumentan la destruccin de glbulos rojos.  Enfermedades de [employer].  Destruccin excesiva de glbulos rojos debido a una infeccin, a medicamentos y a Chartered loss adjuster. SIGNOS Y SNTOMAS   Debilidad leve.   Mareos.   Dolor de [place].  Palpitaciones.   Falta de aire, especialmente con el ejercicio.   Palidez.  Sensibilidad al fro.  Indigestin.  Nuseas.  Dificultad para dormir.  Dificultad para concentrarse. Los sntomas pueden ocurrir repentinamente o pueden Medical illustrator.  DIAGNSTICO  Con frecuencia es necesario realizar anlisis de [employer]. Estos ayudan al profesional a Futures trader. Su mdico controlar la materia fecal para Engineer, manufacturing la presencia de [place] y buscar otras causas de prdida de [place].  TRATAMIENTO  El tratamiento vara segn la causa de la  anemia. Las opciones de tratamiento son:   Suplementos de hierro, vitamina B12, o cido flico.   Medicamentos con hormonas.   Transfusin de [place]. Ser necesaria en los casos de prdida de [place] grave.   Hospitalizacin. Ser necesaria si la prdida de sangre es continua y significativa.   Cambios en la dieta.  Extirpacin del bazo. INSTRUCCIONES PARA EL CUIDADO EN EL HOGAR Cumpla con todas las visitas de control. Generalmente demora varias semanas corregir la anemia, y es muy importante que el mdico controle su enfermedad y su respuesta al [place]. SOLICITE ATENCIN MDICA DE INMEDIATO SI:   Siente debilidad extrema, falta de aire o dolor en el pecho.   Se siente mareado o tiene dificultad para concentrarse.  Tiene una hemorragia vaginal abundante.   Aparece una erupcin cutnea.   La materia fecal es negra, de aspecto alquitranado.   Se desmaya.   Vomita sangre.   Vomita repetidas veces.   Siente dolor abdominal.  Tiene fiebre o sntomas persistentes durante ms de 2 - 3 das.   Tiene fiebre y los sntomas empeoran repentinamente.   Se deshidrata.  ASEGRESE DE QUE:  Comprende estas instrucciones.  Controlar su afeccin.  Recibir ayuda de inmediato si no mejora o si empeora.   Esta informacin no tiene Theme park manager el consejo del  mdico. Asegrese de hacerle al mdico cualquier pregunta que tenga.   Document Released: 08/27/2005 Document Revised: 04/29/2013 Elsevier Interactive Patient Education 2016 [employer].    Deshidratacin en los adultos (Dehydration, Adult) La deshidratacin es un cuadro clnico que se produce cuando no se tiene la cantidad suficiente de lquido o de agua en el organismo. Se produce cuando se toma menos lquido del que se pierde. Los rganos [employer] riones, el cerebro y el [place], no pueden funcionar sin una cantidad [place] de agua y Airline pilot. Cualquier prdida de lquidos del organismo  puede causar deshidratacin.  La deshidratacin puede ser leve o grave. Este cuadro clnico se debe tratar de inmediato para evitar que se agrave. CAUSAS  Este cuadro clnico puede deberse a lo siguiente:  Vmitos.  Diarrea.  Exceso de sudoracin, por ejemplo, al realizar actividad fsica cuando hace calor o hay humedad.  No beber la cantidad suficiente de lquido mientras realizan actividad fsica extenuante o cuando estn enfermos.  Excesiva eliminacin de [place].  Grant Ruts.  Algunos medicamentos. FACTORES DE RIESGO Es ms probable que este cuadro clnico se [employer] en:  Las personas que estn tomando determinados medicamentos que causan la prdida excesiva de lquido del organismo (diurticos).   Las personas que sufren una enfermedad crnica, como diabetes, que puede aumentar la miccin.  Adultos mayores.   Las personas que viven a Development worker, international aid.   Las personas que practican deportes de resistencia.  SNTOMAS  Deshidratacin leve  Sed.  Labios secos.  Sequedad leve en la boca.  Piel seca y caliente. Deshidratacin [employer].   Calambres musculares.   Larose Kells y disminucin de la produccin de [place].   Disminucin de la produccin de lgrimas.   Dolor de [place].   Sensacin de desvanecimiento, especialmente al ponerse de pie.  Deshidratacin grave  Cambios en la piel.   Piel fra y hmeda.   La piel no vuelve rpidamente a su lugar cuando se la suelta luego de pellizcarla ligeramente.   Cambios en los lquidos corporales.   Sed extrema.   Falta de lgrimas.   Imposibilidad de transpirar cuando la temperatura corporal es alta, por ejemplo, cuando hace calor.   Mnima produccin de [place].   Cambios en las constantes vitales.   Pulso rpido y dbil (ms de 100pulsaciones por minuto cuando est quieto).   Respiracin rpida.   Presin arterial baja.   Otros cambios.   Ojos hundidos.   Manos  y pies fros.   Confusin.  Aletargamiento y dificultad para mantenerse despierto.  Desmayos (sncope).   Prdida de peso a Product manager.   Prdida del conocimiento. DIAGNSTICO  Este cuadro clnico se puede diagnosticar en funcin de los sntomas. Tambin se pueden hacer estudios para determinar la gravedad de la deshidratacin. Estos estudios pueden [employer] siguientes:   Anlisis de [place].   Anlisis de [place].  TRATAMIENTO  El tratamiento de este cuadro clnico depende de la gravedad. La deshidratacin leve o moderada a menudo puede tratarse en la casa. El tratamiento se debe comenzar de inmediato. No espere hasta que la deshidratacin sea grave. La deshidratacin grave debe ser tratada en el hospital. Tratamiento para la deshidratacin leve  Beber abundante agua para reemplazar el lquido que se perdi.   Reemplazar los minerales en la sangre (electrolitos) que se pueden haber perdido.  Tratamiento para la deshidratacin moderada  Consumir una solucin de rehidratacin oral (SRO). Tratamiento para la deshidratacin grave  Recibir lquidos a travs de Conservation officer, nature  intravenosa (IV).   Recibir una solucin de electrolitos a travs de una sonda de alimentacin que se coloca a travs de la nariz hasta el estmago (sonda nasogstrica o sonda NG).  Corregir las ONEOKanomalas en los electrolitos. INSTRUCCIONES PARA EL CUIDADO EN EL HOGAR   Beba suficiente lquido para mantener la orina clara o de color amarillo plido.   Beba lentamente pequeos sorbos de agua o de lquido. Tambin puede chupar cubos de hielo.  Consuma alimentos o bebidas que contengan electrolitos. Como por ejemplo, bananas y bebidas deportivas.  Tome los medicamentos de venta libre y los recetados solamente como se lo haya indicado el mdico.   Prepare la solucin de rehidratacin oral de acuerdo con las indicaciones del fabricante. Tome sorbos de la solucin de rehidratacin oral cada 5minutos hasta  que la orina se normalice.  Si tiene vmitos o diarrea, siga tratando de beber agua, una solucin de rehidratacin oral o ambos.   Si tiene Parryvillediarrea, debe evitar lo siguiente:   Las bebidas que contengan cafena.   El jugo de frutas.   MotorolaLa leche.   Las bebidas gaseosas.  No tome comprimidos de sal. Esto puede causar la acumulacin excesiva de sodio en el organismo (hipernatremia).  SOLICITE ATENCIN MDICA SI:  No puede comer ni beber sin vomitar.  Ha tenido diarrea moderada durante ms de 24horas.  Tiene fiebre. SOLICITE ATENCIN MDICA DE INMEDIATO SI:   Siente sed extrema.  Tiene una diarrea intensa.  No ha orinado durante 6 a 8horas o solo ha orinado una cantidad pequea de [place] muy oscura.  Tiene la piel arrugada.  Est mareado, confundido o tiene ambos sntomas.   Esta informacin no tiene Theme park managercomo fin reemplazar el consejo del mdico. Asegrese de hacerle al mdico cualquier pregunta que tenga.   Document Released: 08/27/2005 Document Revised: 05/18/2015 Elsevier Interactive Patient Education [employer]2016 Elsevier Inc.     IF you received an x-ray today, you will receive an invoice from [facility]. Please contact [facility] at [phone] with questions or concerns regarding your invoice.   IF you received labwork today, you will receive an invoice from [facility]. Please contact [facility] at [phone] with questions or concerns regarding your invoice.   Our billing staff will not be able to assist you with questions regarding bills from these companies.  You will be contacted with the lab results as soon as they are available. The fastest way to get your results is to activate your My Chart account. Instructions are located on the last page of this paperwork. If you have not heard from us regarding the results in 2 weeks, please contact this office.

## 2015-12-28 NOTE — Progress Notes (Signed)
MRN: [id number] DOB: 03/03/1981  Subjective:   Heather Schneider is a 35 y.o. female presenting for chief complaint of Migraine; Leg Pain; and boil on hip  Headache - reports ~2 year history of intermittent headaches. H/a is posterior-left temporal mostly. Headache occurs multiple times a week, last most of the day. Associated with nausea, dizziness, decreased appetite, occasional tingling of her face for the past 5 months. She generally does not take medications for this but has tried APAP, Advil occasionally with some help. Sleeps 8 hours generally. Does not eat breakfast, eats twice a day. Drinks ~24-32 ounces of water daily. Does not exercise. Denies smoking cigarettes or drinking alcohol. Patient is married, has 3 children. Separated from her husband in 2014. They recently moved back together. However, the patient no longer wants him there due to issues of him cheating. She feels overwhelmed by this situation and having to take care of her 3 children.  Leg pain - Feels numbness and tingling of her feet. Admits polyuria at night. Reports family history of diabetes in her grandparents, some uncles. Works in English as a second language teacherhousecleaning, is a very physical job. Denies trauma, redness, bony deformity. She bought new shoes to help with the issue but is still having the same problem.   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 She does have spontaneous drainage of thick pus intermittently. She has previously undergone several trials of antibiotics without resolution of her symptoms.   Heather Schneider has a current medication list which includes the following prescription(s): acetaminophen. Also is allergic to penicillins.  Heather Schneider  has no past medical history on file. Also  has no past surgical history on file.  Objective:   Vitals: BP 126/80 mmHg  Pulse 79  Temp(Src) 98 F (36.7 C) (Oral)  Resp 17  Ht 5' 5.5" (1.664 m)  Wt 149 lb (67.586  kg)  BMI 24.41 kg/m2  SpO2 98%  LMP 12/25/2015  Physical Exam  Constitutional: She is oriented to person, place, and time. She appears well-developed and well-nourished.  HENT:  TM's intact bilaterally, no effusions or erythema. Nasal turbinates pink and moist, nasal passages patent. No sinus tenderness. Oropharynx clear, mucous membranes moist, dentition in good repair.  Eyes: Conjunctivae and EOM are normal. Pupils are equal, round, and reactive to light. Right eye exhibits no discharge. Left eye exhibits no discharge. No scleral icterus.  Neck: Normal range of motion. Neck supple. No thyromegaly present.  Cardiovascular: Normal rate, regular rhythm and intact distal pulses.  Exam reveals no gallop and no friction rub.   No murmur heard. Pulmonary/Chest: No respiratory distress. She has no wheezes. She has no rales.  Abdominal: Soft. Bowel sounds are normal. She exhibits no distension and no mass. There is tenderness (generalized throughout).  Musculoskeletal: Normal range of motion. She exhibits no edema or tenderness.  Lymphadenopathy:    She has no cervical adenopathy.  Neurological: She is alert and oriented to person, place, and time. She has normal reflexes. No cranial nerve deficit. Coordination normal.  Skin: Skin is warm and dry. No rash noted. No erythema. No pallor.     Psychiatric: She has a normal mood and affect.   Results for orders placed or performed in visit on 12/28/15 (from the past 24 hour(s))  POCT CBC     Status: Abnormal   Collection Time: 12/28/15 12:04 PM  Result Value Ref Range   WBC 4.5 (A) 4.6 - 10.2  K/uL   Lymph, poc 1.7 0.6 - 3.4   POC LYMPH PERCENT 37.7 10 - 50 %L   MID (cbc) 0.4 0 - 0.9   POC MID % 9.0 0 - 12 %M   POC Granulocyte 2.4 2 - 6.9   Granulocyte percent 53.3 37 - 80 %G   RBC 4.08 4.04 - 5.48 M/uL   Hemoglobin 10.7 (A) 12.2 - 16.2 g/dL   HCT, POC 16.1 (A) 09.6 - 47.9 %   MCV 77.4 (A) 80 - 97 fL   MCH, POC 26.3 (A) 27 - 31.2 pg    MCHC 34.0 31.8 - 35.4 g/dL   RDW, POC 04.5 %   Platelet Count, POC 138 (A) 142 - 424 K/uL   MPV 10.6 0 - 99.8 fL  POCT glycosylated hemoglobin (Hb A1C)     Status: None   Collection Time: 12/28/15 12:04 PM  Result Value Ref Range   Hemoglobin A1C 5.3   POCT urinalysis dipstick     Status: Abnormal   Collection Time: 12/28/15 12:04 PM  Result Value Ref Range   Color, UA yellow yellow   Clarity, UA clear clear   Glucose, UA negative negative   Bilirubin, UA negative negative   Ketones, POC UA negative negative   Spec Grav, UA >=1.030    Blood, UA small (A) negative   pH, UA 5.5    Protein Ur, POC negative negative   Urobilinogen, UA 1.0    Nitrite, UA Negative Negative   Leukocytes, UA Negative Negative   PROCEDURE NOTE: sebaceous cyst excision Verbal consent obtained. Local anesthesia with 2cc of 2% lidocaine with epinephrine. Sterile prep and drape. An elliptical incision was made extending ~2cm using a 15 blade, the cyst was dissected from the surrounding tissue with curved hemostats. Wound closed with #4 4-0 Prolene (1 horizontal mattress, 3 simple interrupted) sutures. Cleansed and dressed.  Assessment and Plan :   1. Stress headache 2. Marital stress 3. Nausea without vomiting 4. Decreased appetite 5. Numbness and tingling - Likely undergoing depression. Labs pending. Diabetes check negative. Patient agreed to start trial of Prozac for depression.  6. Blurred vision - Patient scheduled visit with ophthalmologist for eye exam.  7. Nocturia 8. Dehydration - Diabetes check negative. Advised adequate hydration.  9. Anemia, unspecified anemia type - Ferritin pending. Patient is to start iron supplement.  Wallis Bamberg, PA-C [facility] [phone] 12/28/2015 11:20 AM

## 2016-01-06 ENCOUNTER — Ambulatory Visit (INDEPENDENT_AMBULATORY_CARE_PROVIDER_SITE_OTHER): Payer: Self-pay | Admitting: Urgent Care

## 2016-01-06 VITALS — BP 100/68 | HR 65 | Temp 98.3°F | Resp 17

## 2016-01-06 DIAGNOSIS — Z4802 Encounter for removal of sutures: Secondary | ICD-10-CM

## 2016-01-06 DIAGNOSIS — L723 Sebaceous cyst: Secondary | ICD-10-CM

## 2016-01-06 NOTE — Patient Instructions (Signed)
     IF you received an x-ray today, you will receive an invoice from [facility]. Please contact [facility] at [phone] with questions or concerns regarding your invoice.   IF you received labwork today, you will receive an invoice from [facility]. Please contact [facility] at [phone] with questions or concerns regarding your invoice.   Our billing staff will not be able to assist you with questions regarding bills from these companies.  You will be contacted with the lab results as soon as they are available. The fastest way to get your results is to activate your My Chart account. Instructions are located on the last page of this paperwork. If you have not heard from us regarding the results in 2 weeks, please contact this office.      

## 2016-01-06 NOTE — Progress Notes (Signed)
   Patient: Dorothea GlassmanMaria E Lechuga-Esparza [id number]  Subjective: Byrd HesselbachMaria is returning for suture removal. Patient was initially seen 12/28/2015 and had 4 sutures total placed. Denies fever, drainage of pus or blood, wound dehiscence, edema, pain.   Objective: Physical Exam  Constitutional: She is oriented to person, place, and time and well-developed, well-nourished, and in no distress.  Cardiovascular: Normal rate.   Pulmonary/Chest: Effort normal.  Neurological: She is alert and oriented to person, place, and time.  Skin: Skin is warm and dry.      #4 sutures removed without incident. Patient tolerated this well.  Assessment and Plan: Well-healed wound. Anticipatory guidance provided. Return to clinic as needed.  Wallis BambergMario Chardonnay Holzmann, PA-C [facility] [phone] 01/06/2016  8:27 AM

## 2016-01-31 ENCOUNTER — Ambulatory Visit (INDEPENDENT_AMBULATORY_CARE_PROVIDER_SITE_OTHER): Payer: Self-pay | Admitting: Family Medicine

## 2016-01-31 VITALS — BP 123/81 | HR 69 | Temp 97.9°F | Resp 16 | Ht 66.0 in | Wt 148.0 lb

## 2016-01-31 DIAGNOSIS — R0789 Other chest pain: Secondary | ICD-10-CM

## 2016-01-31 DIAGNOSIS — R072 Precordial pain: Secondary | ICD-10-CM

## 2016-01-31 DIAGNOSIS — R079 Chest pain, unspecified: Secondary | ICD-10-CM

## 2016-01-31 MED ORDER — OMEPRAZOLE 20 MG PO CPDR: 20.0000 mg | DELAYED_RELEASE_CAPSULE | Freq: Every day | ORAL | Status: DC

## 2016-01-31 MED ORDER — HYDROCODONE-ACETAMINOPHEN 5-325 MG PO TABS: 1.0000 | ORAL_TABLET | Freq: Four times a day (QID) | ORAL | Status: DC | PRN

## 2016-01-31 MED ORDER — MELOXICAM 7.5 MG PO TABS: 7.5000 mg | ORAL_TABLET | Freq: Every day | ORAL | Status: DC

## 2016-01-31 NOTE — Progress Notes (Signed)
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 She said that it began in the middle of the night. She has pain with deep inspiration.  She does not lift any heavy objects. She's not had a cough, nausea, sweating, fever.  Objective: NAD, very pleasant middle-aged woman  BP 123/81 mmHg  Pulse 69  Temp(Src) 97.9 F (36.6 C) (Oral)  Resp 16  Ht 5\' 6"  (1.676 m)  Wt 148 lb (67.132 kg)  BMI 23.90 kg/m2  SpO2 100%  LMP 01/17/2016 HEENT: Unremarkable Neck: Supple no adenopathy Chest: Clear no adventitious sounds Heart: Regular, no murmur, no gallop, no rub Abdomen: Soft nontender Skin: No rash seen on the torso  Assessment: Patient has been taking iron and not following with beverage. She thinks she may of forgotten to take it with food the night that this began. He does have many of the features of reflux.  Plan:Sternal pain - Plan: meloxicam (MOBIC) 7.5 MG tablet, HYDROcodone-acetaminophen (NORCO) 5-325 MG tablet  Substernal pain - Plan: omeprazole (PRILOSEC) 20 MG capsule, HYDROcodone-acetaminophen (NORCO) 5-325 MG tablet  I've asked the patient to return if her symptoms persist  Signed, Sheila OatsKurt Kassadi Presswood M.D.

## 2016-01-31 NOTE — Patient Instructions (Addendum)
Si la problema continua, favor de regressar.

## 2016-02-20 ENCOUNTER — Ambulatory Visit (INDEPENDENT_AMBULATORY_CARE_PROVIDER_SITE_OTHER): Payer: Self-pay | Admitting: Physician Assistant

## 2016-02-20 VITALS — BP 116/78 | HR 72 | Temp 97.9°F | Resp 16

## 2016-02-20 DIAGNOSIS — R590 Localized enlarged lymph nodes: Secondary | ICD-10-CM

## 2016-02-20 DIAGNOSIS — R070 Pain in throat: Secondary | ICD-10-CM

## 2016-02-20 DIAGNOSIS — Z113 Encounter for screening for infections with a predominantly sexual mode of transmission: Secondary | ICD-10-CM

## 2016-02-20 LAB — POCT SEDIMENTATION RATE: POCT SED RATE: 25 mm/hr — AB (ref 0–22)

## 2016-02-20 LAB — POCT CBC
GRANULOCYTE PERCENT: 66.3 % (ref 37–80)
HCT, POC: 37.5 % — AB (ref 37.7–47.9)
Hemoglobin: 13 g/dL (ref 12.2–16.2)
Lymph, poc: 1.5 (ref 0.6–3.4)
MCH: 28 pg (ref 27–31.2)
MCHC: 34.7 g/dL (ref 31.8–35.4)
MCV: 80.5 fL (ref 80–97)
MID (CBC): 0.5 (ref 0–0.9)
MPV: 9.6 fL (ref 0–99.8)
PLATELET COUNT, POC: 157 10*3/uL (ref 142–424)
POC GRANULOCYTE: 3.9 (ref 2–6.9)
POC LYMPH PERCENT: 25.6 %L (ref 10–50)
POC MID %: 8.1 %M (ref 0–12)
RBC: 4.66 M/uL (ref 4.04–5.48)
RDW, POC: 18.3 %
WBC: 5.9 10*3/uL (ref 4.6–10.2)

## 2016-02-20 LAB — TSH: TSH: 1.19 m[IU]/L

## 2016-02-20 LAB — POCT RAPID STREP A (OFFICE): Rapid Strep A Screen: NEGATIVE

## 2016-02-20 MED ORDER — AZITHROMYCIN 250 MG PO TABS: ORAL_TABLET | ORAL | Status: AC

## 2016-02-20 NOTE — Progress Notes (Signed)
[facility] [address], [place] [id number] [phone]  Date:  02/20/2016   Name:  Heather Schneider   DOB:  07/15/1981   MRN:  [id number]  PCP:  No PCP Per Patient    History of Present Illness:  Heather Schneider is a 35 y.o. female patient who [facility] for cc of she has right sided with pain for about 3 weeks.  The pain has worsened.  It feels swollen along the right side of her neck.  No fever.  No malaise.  No radiating pain.  No dental issues.  No sob or dypsnea.  She has tenderness with swallowing.  No abdominal pain, or nausea.  No dizziness.     Chief Complaint  Patient presents with  . Sore Throat    x3 weeks, pt. states she feels something in throat.        Patient Active Problem List   Diagnosis Date Noted  . Breast lump on left side at 8 o'clock position 06/02/2013    History reviewed. No pertinent past medical history.  History reviewed. No pertinent past surgical history.  Social History  Substance Use Topics  . Smoking status: Never Smoker   . Smokeless tobacco: None  . Alcohol Use: No    History reviewed. No pertinent family history.  Allergies  Allergen Reactions  . Penicillins Rash    Medication list has been reviewed and updated.  No current outpatient prescriptions on file prior to visit.   No current facility-administered medications on file prior to visit.    ROS ROS otherwise unremarkable unless listed above.   Physical Examination: BP 116/78 mmHg  Pulse 72  Temp(Src) 97.9 F (36.6 C) (Oral)  Resp 16  LMP 02/17/2016 Ideal Body Weight:    Physical Exam  Constitutional: She is oriented to person, place, and time. She appears well-developed and well-nourished. No distress.  HENT:  Head: Normocephalic and atraumatic.  Right Ear: External ear normal.  Left Ear: External ear normal.  Mouth/Throat: No oropharyngeal exudate, posterior oropharyngeal edema or posterior oropharyngeal  erythema.  Eyes: Conjunctivae and EOM are normal. Pupils are equal, round, and reactive to light.  Neck: No thyroid mass and no thyromegaly present.  Cardiovascular: Normal rate and regular rhythm.  Exam reveals no gallop and no friction rub.   No murmur heard. Pulmonary/Chest: Effort normal. No respiratory distress. She has no decreased breath sounds. She has no wheezes. She has no rhonchi.  Lymphadenopathy:       Head (right side): No submental, no submandibular, no preauricular and no posterior auricular adenopathy present.       Head (left side): No submental, no submandibular, no preauricular and no posterior auricular adenopathy present.    She has cervical adenopathy.       Right cervical: Superficial cervical adenopathy present.       Left cervical: No superficial cervical adenopathy present.  Neurological: She is alert and oriented to person, place, and time.  Skin: She is not diaphoretic.  Psychiatric: She has a normal mood and affect. Her behavior is normal.   Results for orders placed or performed in visit on 02/20/16  POCT rapid strep A  Result Value Ref Range   Rapid Strep A Screen Negative Negative  POCT CBC  Result Value Ref Range   WBC 5.9 4.6 - 10.2 K/uL   Lymph, poc 1.5 0.6 - 3.4   POC LYMPH PERCENT 25.6 10 - 50 %L   MID (  cbc) 0.5 0 - 0.9   POC MID % 8.1 0 - 12 %M   POC Granulocyte 3.9 2 - 6.9   Granulocyte percent 66.3 37 - 80 %G   RBC 4.66 4.04 - 5.48 M/uL   Hemoglobin 13.0 12.2 - 16.2 g/dL   HCT, POC 69.6 (A) 29.5 - 47.9 %   MCV 80.5 80 - 97 fL   MCH, POC 28.0 27 - 31.2 pg   MCHC 34.7 31.8 - 35.4 g/dL   RDW, POC 28.4 %   Platelet Count, POC 157 142 - 424 K/uL   MPV 9.6 0 - 99.8 fL  POCT SEDIMENTATION RATE  Result Value Ref Range   POCT SED RATE 25 (A) 0 - 22 mm/hr    Assessment and Plan: Heather Schneider is a 35 y.o. female who is here today for cc of right sided throat pain. --elevated sed rate.  Will treat empirically at this time.  I have  advised that we do an [place] in 1-2 weeks.  This was placed today.  Testing thyroid at this time as well. She has asked for std testing and this was obtained.  Screening for STD (sexually transmitted disease) - Plan: HIV antibody, RPR, GC/Chlamydia Probe Amp, azithromycin (ZITHROMAX) 250 MG tablet  Throat pain - Plan: POCT rapid strep A, POCT CBC, POCT SEDIMENTATION RATE, Culture, Group A Strep, azithromycin (ZITHROMAX) 250 MG tablet, US SOFT TISSUE NECK  Lymphadenopathy of right cervical region - Plan: US SOFT TISSUE NECK, TSH  Trena Platt, PA-C [facility] 02/20/2016 12:43 PM

## 2016-02-20 NOTE — Patient Instructions (Addendum)
     IF you received an x-ray today, you will receive an invoice from [facility]. Please contact [facility] at [phone] with questions or concerns regarding your invoice.   IF you received labwork today, you will receive an invoice from [facility]. Please contact [facility] at [phone] with questions or concerns regarding your invoice.   Our billing staff will not be able to assist you with questions regarding bills from these companies.  You will be contacted with the lab results as soon as they are available. The fastest way to get your results is to activate your My Chart account. Instructions are located on the last page of this paperwork. If you have not heard from us regarding the results in 2 weeks, please contact this office.    I am going to place you on azithromycin.  We are going to have you do an ultrasound on the neck area.  Please await the contact.  I will have the results of your lab work within the next week-10 days.

## 2016-02-21 LAB — HIV ANTIBODY (ROUTINE TESTING W REFLEX): HIV: NONREACTIVE

## 2016-02-21 LAB — GC/CHLAMYDIA PROBE AMP
CT Probe RNA: NOT DETECTED
GC Probe RNA: NOT DETECTED

## 2016-02-21 LAB — RPR

## 2016-02-22 LAB — CULTURE, GROUP A STREP: ORGANISM ID, BACTERIA: NORMAL

## 2016-03-01 ENCOUNTER — Ambulatory Visit
Admission: RE | Admit: 2016-03-01 | Discharge: 2016-03-01 | Disposition: A | Discharge status: Home / Self Care | Payer: No Typology Code available for payment source | Source: Ambulatory Visit | Attending: Physician Assistant | Admitting: Physician Assistant

## 2017-08-07 ENCOUNTER — Encounter (HOSPITAL_COMMUNITY): Payer: Self-pay
